# Patient Record
Sex: Female | Born: 1970 | Race: Black or African American | Hispanic: No | Smoking: Never smoker
Health system: Southern US, Community
[De-identification: ages and names within clinical notes are randomized; demographics above are authoritative.]

## PROBLEM LIST (undated history)

## (undated) DIAGNOSIS — I10 Essential (primary) hypertension: Secondary | ICD-10-CM

## (undated) DIAGNOSIS — E119 Type 2 diabetes mellitus without complications: Secondary | ICD-10-CM

## (undated) DIAGNOSIS — M25561 Pain in right knee: Secondary | ICD-10-CM

## (undated) DIAGNOSIS — E7800 Pure hypercholesterolemia, unspecified: Secondary | ICD-10-CM

## (undated) DIAGNOSIS — D649 Anemia, unspecified: Secondary | ICD-10-CM

## (undated) HISTORY — PX: BRAIN SURGERY: SHX531

## (undated) HISTORY — PX: ABDOMINAL HYSTERECTOMY: SHX81

## (undated) HISTORY — DX: Pain in right knee: M25.561

## (undated) HISTORY — DX: Pure hypercholesterolemia, unspecified: E78.00

## (undated) HISTORY — DX: Anemia, unspecified: D64.9

## (undated) HISTORY — DX: Type 2 diabetes mellitus without complications: E11.9

## (undated) HISTORY — PX: CHOLECYSTECTOMY: SHX55

---

## 1998-06-28 ENCOUNTER — Emergency Department (HOSPITAL_COMMUNITY): Admission: EM | Admit: 1998-06-28 | Discharge: 1998-06-28 | Payer: Self-pay | Admitting: Emergency Medicine

## 1999-03-06 ENCOUNTER — Other Ambulatory Visit: Admission: RE | Admit: 1999-03-06 | Discharge: 1999-03-06 | Payer: Self-pay | Admitting: Family Medicine

## 1999-03-17 ENCOUNTER — Other Ambulatory Visit: Admission: RE | Admit: 1999-03-17 | Discharge: 1999-03-17 | Payer: Self-pay | Admitting: Obstetrics and Gynecology

## 1999-03-17 ENCOUNTER — Encounter (INDEPENDENT_AMBULATORY_CARE_PROVIDER_SITE_OTHER): Payer: Self-pay

## 1999-08-04 ENCOUNTER — Ambulatory Visit (HOSPITAL_COMMUNITY): Admission: RE | Admit: 1999-08-04 | Discharge: 1999-08-04 | Payer: Self-pay | Admitting: Family Medicine

## 1999-08-04 ENCOUNTER — Encounter: Payer: Self-pay | Admitting: Family Medicine

## 2003-07-31 ENCOUNTER — Emergency Department (HOSPITAL_COMMUNITY): Admission: EM | Admit: 2003-07-31 | Discharge: 2003-07-31 | Payer: Self-pay | Admitting: Emergency Medicine

## 2003-08-16 ENCOUNTER — Other Ambulatory Visit: Admission: RE | Admit: 2003-08-16 | Discharge: 2003-08-16 | Payer: Self-pay | Admitting: Gynecology

## 2003-10-02 ENCOUNTER — Ambulatory Visit: Admission: RE | Admit: 2003-10-02 | Discharge: 2003-10-02 | Payer: Self-pay | Admitting: Gynecology

## 2003-11-21 ENCOUNTER — Inpatient Hospital Stay (HOSPITAL_COMMUNITY): Admission: RE | Admit: 2003-11-21 | Discharge: 2003-11-24 | Payer: Self-pay | Admitting: Surgery

## 2003-11-21 ENCOUNTER — Encounter (INDEPENDENT_AMBULATORY_CARE_PROVIDER_SITE_OTHER): Payer: Self-pay | Admitting: Specialist

## 2005-02-02 ENCOUNTER — Ambulatory Visit: Payer: Self-pay | Admitting: Family Medicine

## 2005-02-02 ENCOUNTER — Other Ambulatory Visit: Admission: RE | Admit: 2005-02-02 | Discharge: 2005-02-02 | Payer: Self-pay | Admitting: Family Medicine

## 2005-12-31 ENCOUNTER — Ambulatory Visit: Payer: Self-pay | Admitting: Family Medicine

## 2006-01-14 ENCOUNTER — Encounter: Admission: RE | Admit: 2006-01-14 | Discharge: 2006-01-14 | Payer: Self-pay | Admitting: Family Medicine

## 2007-11-27 ENCOUNTER — Encounter: Admission: RE | Admit: 2007-11-27 | Discharge: 2007-11-27 | Payer: Self-pay | Admitting: Family Medicine

## 2007-12-13 ENCOUNTER — Encounter: Admission: RE | Admit: 2007-12-13 | Discharge: 2008-01-05 | Payer: Self-pay | Admitting: Internal Medicine

## 2011-01-22 NOTE — Discharge Summary (Signed)
NAME:  Lisa Cross, Lisa Cross                          ACCOUNT NO.:  [id number]   MEDICAL RECORD NO.:  [id number]                   PATIENT TYPE:  INP   LOCATION:  0471                                 FACILITY:  [facility]   PHYSICIAN:  Ivor Costa. Farrel Gobble, M.D.              DATE OF BIRTH:  02/24/1971   DATE OF ADMISSION:  11/21/2003  DATE OF DISCHARGE:  11/24/2003                                 DISCHARGE SUMMARY   CHIEF COMPLAINT:  Large symptomatic fibroid uterus.   ADDITIONAL COMPLAINT:  Symptomatic gallbladder.   HISTORY OF PRESENT ILLNESS:  Please refer to the dictated H&P. Briefly, the  patient is a 40 year old, G1, P1, who was noted to have a large fibroid  uterus measuring 20 x 13.5 x 15.5. She was also noted to have sludge in her  gallbladder as well as elevated LFTs and abnormal coags.   HOSPITAL COURSE:  Because of the above complaints, the patient presented to  the hospital on the morning of November 21, 2003, and underwent a laparoscopic  cholecystectomy done by Dr. Ezzard Standing. Afterwards, the patient had a total  abdominal hysterectomy, left salpingo-oophorectomy, right salpingectomy, and  lysis of adhesions done by myself. Intraoperatively, she was noted to have a  large fibroid uterus that was approximately 24 weeks size with a single  lesion. She was noted to have bilateral hydrosalpinx, extensive tubo-ovarian  adhesions, bladder adhesions, but no evidence of endometriosis. Her blood  loss intraoperatively was approximately 400 from a hysterectomy perspective.  She was extubated in the OR, transferred to the PACU and then to the  postoperative floor in due fashion. Her postoperative course was  unremarkable. She remained afebrile and she vital signs were stable. She  reached a normal postoperative milestone appropriately. Her postoperative  hemoglobin and hematocrit were 8.8 and 27.5 with white count of 8.8 and  platelet count 355,000. By the time of discharge, postoperative day #3,  the  patient was tolerating a regular diet without any difficulty, she was  ambulating without any difficulty, she was using an appropriate amount of  pain medication, incision was intact with staples in place.  As a result  there of the patient was discharged home on postoperative day #3. She was  instructed from a GYN perspective that she would follow up next week for  staple removal. She will use over-the-counter Motrin for pain medication.  The patient will be given a prescription for Percocet for her original  scheduled January surgery; however, she believes she has lost her  prescription. She was given a prescription for Tylox one to two q.4h. p.r.n.  pain, #40, without refill. She was instructed to avoid driving for the first  two weeks. She will follow up with general surgeons as they have scheduled.  She will follow up with [place] in one week for staple removal, in six weeks for  usual postoperative follow-up.  At the time of this  dictation the final  pathology is still pending.                                               Ivor Costa. Farrel Gobble, M.D.    Leda Roys  D:  11/24/2003  T:  11/26/2003  Job:  [id number]

## 2011-01-22 NOTE — H&P (Signed)
NAME:  Lisa Cross, Lisa Cross                          ACCOUNT NO.:  [id number]   MEDICAL RECORD NO.:  [id number]                   PATIENT TYPE:  INP   LOCATION:  NA                                   FACILITY:  [facility]   PHYSICIAN:  Ivor Costa. Farrel Gobble, M.D.              DATE OF BIRTH:  1970/10/05   DATE OF ADMISSION:  DATE OF DISCHARGE:                                HISTORY & PHYSICAL   CHIEF COMPLAINT:  Fibroid uterus.   HISTORY OF PRESENT ILLNESS:  The patient is a 40 year old G1, P1, referred  by Dr. Ezzard Standing for evaluation of a fibroid uterus back in December 2004.  The  patient had presented to [facility] earlier with acute onset of low back  pain.  As part of their evaluation the patient had an upper GI, abdominal  pelvic ultrasound done at [facility].  The abdominal ultrasound  was remarkable for a large fibroid uterus that measured 20 x 13.5 x 15.5 cm.  There was a fundal fibroid felt to be 13 x 11 x 13.  The patient was also  noted to have a gallbladder with echogenic material, gallstones and sludge.  The patient had had one spontaneous pregnancy 15 years ago.  She has been  using withdrawal for contraception since that time.  She has not conceived a  pregnancy but states that she may be interested in getting pregnant.  As  part of her evaluation from [facility], the patient had a metabolic panel  done.  She was noted to have elevated LFTs, for which hepatitis serologies  were done.  These were negative.   PAST OBSTETRICAL/GYNECOLOGIC HISTORY:  The patient states her menses are  monthly.  She has five days of flow.  The last Pap was within normal limits.  Denies a history of abnormal Pap smears.   PAST MEDICAL HISTORY:  Significant for brain tumor diagnosed in 1996, for  which she had some sort of surgery done, which turned out to be benign.  The  patient could not further elaborate.   MEDICATIONS:  None.   ALLERGIES:  None.   SOCIAL HISTORY:  No alcohol, tobacco,  caffeine.   FAMILY HISTORY:  Negative for GYN cancers, negative for breast cancer.  Her  parents are alive and well.   PHYSICAL EXAMINATION:  GENERAL:  She is well-appearing, in no acute  distress.  CARDIAC:  Her heart was regular rate.  CHEST:  Her lungs are clear to auscultation.  BREASTS:  Without mass, discharge, retractions, or skin changes.  ABDOMEN:  There is a protuberant mass that measures 4 cm above the umbilicus  and lateral hip to hip consistent with her uterus.  It is mobile.  PELVIC:  On GYN exam she has normal female genitalia.  The BUS is negative,  and the vagina was pink and moist.  The cervix is without lesions.  Bimanual  exam:  A fibroid is palpable  in the fundal area.  It is sitting above the  pubic symphysis.  The uterus is almost completely palpable separate from  this area.  This is appreciated mostly rectovaginally.  The adnexa were not  palpable.  There was no noted impingement on rectal exam.   The findings were discussed with the patient.  Because of the excessive size  of this fibroid, we recommend a myomectomy.  The patient is aware that she  is at increased risk for a hysterectomy.  Knowing her desire for a possible  additional pregnancy, we had recommended that the patient proceed with an  HSG.  She, unfortunately, failed to have that done.  Her surgery was  originally scheduled in January and although it was cancelled because of  illness, she still failed to have that done.  However, if possible, we will  try to preserve her fertility.  Because of her elevated LFTs, she will  proceed with a cholecystectomy to be done prior to our procedure.  She had  coagulation studies that were mildly elevated done for the January surgery.  The repeat labs are not available at the time of this dictation.  She had  been given a prescription for Lortab 5 mg one to two q.4h. p.r.n., #40,  without refill at the time of our preop consultation.  All questions were   addressed.  Again, she will present for a cholecystectomy to be done prior  to her myomectomy, possible hysterectomy, in the morning.                                               Ivor Costa. Farrel Gobble, M.D.    THL/MEDQ  D:  11/20/2003  T:  11/20/2003  Job:  [id number]

## 2011-01-22 NOTE — Op Note (Signed)
NAME:  Lisa Cross, Lisa Cross                          ACCOUNT NO.:  [id number]   MEDICAL RECORD NO.:  [id number]                   PATIENT TYPE:  INP   LOCATION:  0005                                 FACILITY:  [facility]   PHYSICIAN:  Sandria Bales. Ezzard Standing, M.D.               DATE OF BIRTH:  July 06, 1971   DATE OF PROCEDURE:  11/21/2003  DATE OF DISCHARGE:                                 OPERATIVE REPORT   PREOPERATIVE DIAGNOSES:  1. Chronic cholecystitis and cholelithiasis and elevated liver function     tests.  2. Large uterine fibroid.   POSTOPERATIVE DIAGNOSES:  1. Chronic cholecystitis and cholelithiasis.  2. Large uterine fibroid.   OPERATION/PROCEDURE:  1. Laparoscopic cholecystectomy with intraoperative cholangiogram.  2. Dr. Douglass Rivers will dictate the surgery for the uterine fibroid.   SURGEON:  Sandria Bales. Ezzard Standing, M.D.   FIRST ASSISTANT:  Anselm Pancoast. Zachery Dakins, M.D.   ANESTHESIA:  General endotracheal anesthesia.   ESTIMATED BLOOD LOSS:  Minimal.   INDICATIONS FOR PROCEDURE:  Lisa Cross is a 40 year old black female who  presented with a large uterine fibroid which extends above her umbilicus.  She has seen Dr. Douglass Rivers for this and plans surgery for management of  this uterine fibroid.  At the same time, she was found to have gallstones on  an ultrasound with mildly elevated liver functions initially which has now  returned to normal, so I plan to do a cholecystectomy at the same time.   The indications and potential complications were explained to the patient.  Potential complications include but not limited to bleeding, infection, bile  duct injury, open surgery.  The patient understands Dr. Farrel Gobble will be  doing open surgery to get the uterine fibroid out.   DESCRIPTION OF PROCEDURE:  The patient is placed in the supine position,  given a general endotracheal anesthesia.  She had an abdominal prep with  Betadine solution and sterilely draped.   I went through  infraumbilical incision with sharp dissection carried down to  the abdominal cavity.  I inserted a 12 mm Hasson trocar, secured this with a  0 Vicryl suture and inserted a 0-degree, 10 mm laparoscope.   The right and left lobes of the liver were unremarkable.  Anterior wall of  the stomach was unremarkable.  The gallbladder had evidence of some scar  tissue around it consistent with chronic cholecystitis.  In the abdomen, the  patient had a dominant uterine fibroid.  I did take pictures of this uterine  fibroid.   Three additional trocars were placed.  A 10 mm Ethicon trocar in the  subxiphoid location, a 5 mm Ethicon trocar in the mid subcostal location and  a 5 mm Ethicon trocar in the right lateral subcostal location.  The  gallbladder was identified, grasped, rotated cephalad.  Sharp dissection was  carried out identifying the cystic node.  The gallbladder cystic duct was  fairly  coiled.  I straightened this out with dissection, identified a cystic  artery which I triply then clipped and divided.  Then identified the  junction of the gallbladder and the cystic duct.  I put a clip on the side  of the gallbladder.   The intraoperative cholangiogram was shot using cut-off Taut catheter  inserted through a 14-gauge Jelco catheter.  This Taut catheter was inserted  in the side of the cut cystic duct.  I injected about 6 mL of half-strength  Hypaque solution into the cystic duct.  This flowed freely down the common  bile duct into the duodenum and refluxed back up the hepatic radical.  This  was felt to be a normal intraoperative cholangiogram due to no filling  defect, no abnormality.  The Taut catheter was then removed.  The cystic  duct was then triply clipped and divided.  We sharply and bluntly dissected  the gallbladder bed using primarily hook electrocautery.  I did find a  posterior branch of the cystic artery which I doubly clipped, hooked and  divided.  Then __________ division  of the gallbladder bed.  I revisualized  the triangle of Calot and revisualized the gallbladder bed.  There was no  bleeding, no bile leak.  The gallbladder was then divided, placed it into an  EndoCatch and delivered through the umbilicus.   I irrigated the abdomen out with 500 mL of saline.  I then removed all the  trocars under visualization.  There was no bleeding at each trocar site.  I  closed the skin at the two 5 mm trocars and the 10 mm trocar in the  subxiphoid location.  I used to 0 Vicryl suture to close the umbilical  trocar but left the skin open.  Dr. Farrel Gobble plans to go through a lower mid  abdominal incision and will probably use my infraumbilical incision as part  of her incision.   This ends my part of the operation.  At this point Dr. Farrel Gobble came in and  scrubbed to start the surgery on the uterine fibroid.                                               Sandria Bales. Ezzard Standing, M.D.    DHN/MEDQ  D:  11/21/2003  T:  11/21/2003  Job:  [id number]   cc:   Ivor Costa. Farrel Gobble, M.D.  [address], [place]  [place]  [place] [address]  Fax: [phone]   Gabriel Earing, M.D.  [address]  [place]  [place] [address]  Fax: [phone]

## 2012-12-03 ENCOUNTER — Ambulatory Visit (INDEPENDENT_AMBULATORY_CARE_PROVIDER_SITE_OTHER): Payer: Managed Care, Other (non HMO) | Admitting: Physician Assistant

## 2012-12-03 VITALS — BP 168/95 | HR 92 | Temp 98.0°F | Resp 16 | Ht 65.18 in | Wt 213.0 lb

## 2012-12-03 DIAGNOSIS — K130 Diseases of lips: Secondary | ICD-10-CM

## 2012-12-03 DIAGNOSIS — Z0000 Encounter for general adult medical examination without abnormal findings: Secondary | ICD-10-CM

## 2012-12-03 DIAGNOSIS — N898 Other specified noninflammatory disorders of vagina: Secondary | ICD-10-CM

## 2012-12-03 DIAGNOSIS — B9689 Other specified bacterial agents as the cause of diseases classified elsewhere: Secondary | ICD-10-CM

## 2012-12-03 DIAGNOSIS — N760 Acute vaginitis: Secondary | ICD-10-CM

## 2012-12-03 LAB — POCT WET PREP WITH KOH
Clue Cells Wet Prep HPF POC: 100
KOH Prep POC: NEGATIVE
RBC Wet Prep HPF POC: NEGATIVE
Trichomonas, UA: NEGATIVE
WBC Wet Prep HPF POC: NEGATIVE
Yeast Wet Prep HPF POC: NEGATIVE

## 2012-12-03 LAB — LIPID PANEL
Cholesterol: 147 mg/dL (ref 0–200)
HDL: 34 mg/dL — ABNORMAL LOW (ref >39)
LDL Cholesterol: 87 mg/dL (ref 0–99)
Total CHOL/HDL Ratio: 4.3 Ratio
Triglycerides: 131 mg/dL (ref <150)
VLDL: 26 mg/dL (ref 0–40)

## 2012-12-03 LAB — POCT CBC
Granulocyte percent: 53.3 %G (ref 37–80)
HCT, POC: 39.5 % (ref 37.7–47.9)
Hemoglobin: 12.3 g/dL (ref 12.2–16.2)
Lymph, poc: 2.4 (ref 0.6–3.4)
MCH, POC: 25.6 pg — AB (ref 27–31.2)
MCHC: 31.1 g/dL — AB (ref 31.8–35.4)
MCV: 82.2 fL (ref 80–97)
MID (cbc): 0.4 (ref 0–0.9)
MPV: 8.7 fL (ref 0–99.8)
POC Granulocyte: 3.3 (ref 2–6.9)
POC LYMPH PERCENT: 39.4 %L (ref 10–50)
POC MID %: 7.3 %M (ref 0–12)
Platelet Count, POC: 364 10*3/uL (ref 142–424)
RBC: 4.8 M/uL (ref 4.04–5.48)
RDW, POC: 15.6 %
WBC: 6.1 10*3/uL (ref 4.6–10.2)

## 2012-12-03 LAB — HIV ANTIBODY (ROUTINE TESTING W REFLEX): HIV: NONREACTIVE

## 2012-12-03 LAB — RPR

## 2012-12-03 LAB — POCT URINALYSIS DIPSTICK
Bilirubin, UA: NEGATIVE
Blood, UA: NEGATIVE
Glucose, UA: NEGATIVE
Ketones, UA: NEGATIVE
Leukocytes, UA: NEGATIVE
Nitrite, UA: NEGATIVE
Protein, UA: NEGATIVE
Spec Grav, UA: 1.02
Urobilinogen, UA: 0.2
pH, UA: 5

## 2012-12-03 LAB — COMPREHENSIVE METABOLIC PANEL
ALT: 16 U/L (ref 0–35)
AST: 12 U/L (ref 0–37)
Albumin: 4.2 g/dL (ref 3.5–5.2)
Alkaline Phosphatase: 68 U/L (ref 39–117)
BUN: 13 mg/dL (ref 6–23)
CO2: 25 mEq/L (ref 19–32)
Calcium: 9.5 mg/dL (ref 8.4–10.5)
Chloride: 107 mEq/L (ref 96–112)
Creat: 0.81 mg/dL (ref 0.50–1.10)
Glucose, Bld: 127 mg/dL — ABNORMAL HIGH (ref 70–99)
Potassium: 5 mEq/L (ref 3.5–5.3)
Sodium: 139 mEq/L (ref 135–145)
Total Bilirubin: 0.4 mg/dL (ref 0.3–1.2)
Total Protein: 7.2 g/dL (ref 6.0–8.3)

## 2012-12-03 LAB — TSH: TSH: 1.003 u[IU]/mL (ref 0.350–4.500)

## 2012-12-03 MED ORDER — METRONIDAZOLE 0.75 % VA GEL: 1.0000 | Freq: Every day | VAGINAL | Status: DC

## 2012-12-03 MED ORDER — CLOTRIMAZOLE 1 % EX CREA: TOPICAL_CREAM | Freq: Two times a day (BID) | CUTANEOUS | Status: DC

## 2012-12-03 NOTE — Progress Notes (Signed)
Subjective:    Patient ID: Lisa Cross, female    DOB: Aug 29, 1971, 42 y.o.   MRN: [id number]  HPI 42 year old female here today for CPE.  Last physical 1 year ago. No known medical problems. Does have history of GI complaints including abdominal pain and loose stools after meals. This has been worked up by her previous PCP and everything was found to be normal. She has not seen a GI doctor for this.  Had gallbladder out but that has not helped her pain.  History of fibroid tumor removed as well.  Also complains of inability to lose weight despite several different trials of diet pills, exercise, and healthy eating.  Is interested in having an appetite suppressant rx'ed. She has never had a mammogram. Does do self breast exams and has no areas of concern.  Admits to slight vaginal discharge that she has noticed over the past few weeks.  Complains of skin irritation around her mouth x 3 days. Describes as a burning, stinging sensation that is slightly pruritic. She has been using vitamin E ointment and cocoa butter which have not helped.  No history of cold sores.   In addition she would like to be screened for STI's today because she is concerned her husband has not been faithful. She is married with 1 child who is 25.    Review of Systems  Constitutional: Negative.   HENT: Negative.   Eyes: Negative.   Respiratory: Negative.   Cardiovascular: Negative.   Gastrointestinal: Positive for abdominal pain and diarrhea (loose stools). Negative for nausea, vomiting and blood in stool.  Endocrine: Negative.   Genitourinary: Positive for vaginal discharge.  Musculoskeletal: Negative.   Skin: Positive for rash (around mouth).  Allergic/Immunologic: Negative.   Neurological: Negative.   Hematological: Negative.   Psychiatric/Behavioral: Negative.        Objective:   Physical Exam  Constitutional: She is oriented to person, place, and time. She appears well-developed and well-nourished.  HENT:   Head: Normocephalic and atraumatic.  Right Ear: Hearing, tympanic membrane, external ear and ear canal normal.  Left Ear: Hearing, external ear and ear canal normal.  Mouth/Throat: Uvula is midline, oropharynx is clear and moist and mucous membranes are normal. No oropharyngeal exudate.  Eyes: Conjunctivae are normal.  Neck: Normal range of motion. Neck supple. No thyromegaly present.  Cardiovascular: Normal rate, regular rhythm and normal heart sounds.   Pulmonary/Chest: Effort normal and breath sounds normal.  Abdominal: Soft. Bowel sounds are normal. There is no tenderness. There is no rebound and no guarding.  Genitourinary: Vagina normal. Pelvic exam was performed with patient supine. There is no rash or tenderness on the right labia. There is no rash or tenderness on the left labia. Right adnexum displays no mass. Left adnexum displays no mass. No erythema around the vagina. No vaginal discharge found.  Patient s/p hysterectomy   Lymphadenopathy:    She has no cervical adenopathy.  Neurological: She is alert and oriented to person, place, and time.  Psychiatric: She has a normal mood and affect. Her behavior is normal. Judgment and thought content normal.          Assessment & Plan:  1. Routine general medical examination at a health care facility - Plan: POCT CBC, POCT urinalysis dipstick, HIV antibody, RPR, Lipid panel, TSH, Comprehensive metabolic panel, Pap IG, CT/NG w/ reflex HPV when ASC-U, MM Digital Screening  -Labs pending  -Blood pressure elevated today. Discussed with patient - states she has  history of elevated BP while at the doctor's  office, but at home runs 120/70-130/80.  Recommend close monitoring - if continues to be high, will need to consider  trial of medication 2. Leukorrhea, not specified as infective - Plan: POCT Wet Prep with KOH  -Metronidazole gel 1 applicatorful qhs x 5 days 3. Angular cheilitis - Plan: clotrimazole (LOTRIMIN) 1 % cream  -Clotrimazole  1% cream twice daily.   -Hydrocortisone cream 1-2 hours after application of clotrimazole.  4. Bacterial vaginosis - Plan: metroNIDAZOLE (METROGEL) 0.75 % vaginal gel  -See above

## 2012-12-05 LAB — PAP IG, CT-NG, RFX HPV ASCU
Chlamydia Probe Amp: NEGATIVE
GC Probe Amp: NEGATIVE

## 2012-12-08 ENCOUNTER — Ambulatory Visit: Payer: Managed Care, Other (non HMO) | Admitting: Physician Assistant

## 2012-12-08 VITALS — BP 138/100 | HR 96 | Temp 98.6°F | Resp 18 | Ht 65.0 in | Wt 213.0 lb

## 2012-12-08 DIAGNOSIS — R21 Rash and other nonspecific skin eruption: Secondary | ICD-10-CM

## 2012-12-08 NOTE — Patient Instructions (Signed)
Thin layer of hydrocortisone at bedtime. No longer than 2 weeks.  Aquaphor or Vaseline to lips to keep moisture in Facial moisturizer such as Cetaphil or Neutragena with SPF to face daily in the morning.  This should resolve on its own - it may take 1-2 weeks. Please let us know if this is not improving and we can organize a Dermatology Referral.

## 2012-12-08 NOTE — Progress Notes (Signed)
  Subjective:    Patient ID: Lisa Cross, female    DOB: July 29, 1971, 42 y.o.   MRN: [id number]  HPI 42 year old female presents for recheck of rash on lips. Was here on 12/04/12 and placed on clotrimazole cream twice daily with hydrocortisone cream at bedtime.  She has been doing this and states the burning sensation and red irritation has significantly improved. However now she has a darkened area above her lips and at both corners.  She states there is no irritation, but she is concerned about the appearance. Has stopped both clotrimazole and hydrocortisone creams.  Admits there is some peeling of her lips but otherwise she feels well.      Review of Systems  Constitutional: Negative for fever and chills.  Gastrointestinal: Negative for nausea and vomiting.  Musculoskeletal: Negative for myalgias and arthralgias.  Skin: Positive for rash.  Neurological: Negative for dizziness and headaches.       Objective:   Physical Exam  Constitutional: She is oriented to person, place, and time. She appears well-developed and well-nourished.  HENT:  Head: Normocephalic and atraumatic.  Right Ear: External ear normal.  Left Ear: External ear normal.  Eyes: Conjunctivae are normal.  Neck: Normal range of motion.  Cardiovascular: Normal rate.   Pulmonary/Chest: Effort normal.  Neurological: She is alert and oriented to person, place, and time.  Skin:  Area of hyperpigmentation on upper lip and at both corners of the her mouth.  No warmth, drainage, vesicles, or blisters.  Mild peeling of lips.   Psychiatric: She has a normal mood and affect. Her behavior is normal. Judgment and thought content normal.          Assessment & Plan:  Rash and nonspecific skin eruption  Likely post-inflammatory hyperpigmentation.   Reassurance provided Continue hydrocortisone cream - thin layer at bedtime followed by Aquaphor or Vaseline Daily facial moisturizer with SPF - Cetaphil or Neutragena recommended.    If no improvement in 1-2 weeks, referral to Dermatology

## 2012-12-10 ENCOUNTER — Ambulatory Visit: Payer: Managed Care, Other (non HMO) | Admitting: Family Medicine

## 2012-12-10 VITALS — BP 164/98 | HR 109 | Temp 98.9°F | Resp 17 | Ht 65.5 in | Wt 215.0 lb

## 2012-12-10 DIAGNOSIS — R7309 Other abnormal glucose: Secondary | ICD-10-CM

## 2012-12-10 DIAGNOSIS — N898 Other specified noninflammatory disorders of vagina: Secondary | ICD-10-CM

## 2012-12-10 DIAGNOSIS — R739 Hyperglycemia, unspecified: Secondary | ICD-10-CM

## 2012-12-10 LAB — POCT WET PREP WITH KOH
KOH Prep POC: NEGATIVE
Trichomonas, UA: NEGATIVE
Yeast Wet Prep HPF POC: NEGATIVE

## 2012-12-10 LAB — GLUCOSE, POCT (MANUAL RESULT ENTRY): POC Glucose: 166 mg/dl — AB (ref 70–99)

## 2012-12-10 NOTE — Progress Notes (Signed)
Subjective: 42 year old lady who was treated here with a week ago for a nonspecific leukorrhea. She was given MetroGel. She sees her for a week, and it is felt weak. She continues to have a profuse creamy discharge. He does not have much symptoms. Her last intercourse was 2 weeks ago at which time she used protection. She has had a hysterectomy as well as a unilateral oophorectomy. She did not notice any odor to the discharge. The other cream is given her for a rash is doing well. She has been told she was borderline diabetic  Objective: Overweight Afro-American female in no acute distress. Normal external genitalia. Vaginal mucosa looks pink and healthy. One Wilford cough of whitish stuff, but otherwise just a Kelleher mucoid discharge. Wet prep was taken.  Assessment: Vaginal discharge History of "borderline" diabetes but not being diabetic.  Plan: Wet prep Fingerstick glucose  Results for orders placed in visit on 12/10/12  GLUCOSE, POCT (MANUAL RESULT ENTRY)      Result Value Range   POC Glucose 166 (*) 70 - 99 mg/dl  POCT WET PREP WITH KOH      Result Value Range   Trichomonas, UA Negative     Clue Cells Wet Prep HPF POC 0-3     Epithelial Wet Prep HPF POC 1-8     Yeast Wet Prep HPF POC neg     Bacteria Wet Prep HPF POC 1+     RBC Wet Prep HPF POC 0-1     WBC Wet Prep HPF POC 0-3     KOH Prep POC Negative

## 2012-12-10 NOTE — Patient Instructions (Addendum)
Work hard on weight loss and exercise and eating less on consistent basis.  Return in June for a followup check on your sugars.  Do nothing for the vagina at this time, and I think it will do fine over the next week.

## 2012-12-20 ENCOUNTER — Ambulatory Visit (HOSPITAL_COMMUNITY)
Admission: RE | Admit: 2012-12-20 | Discharge: 2012-12-20 | Disposition: A | Discharge status: Home / Self Care | Payer: Managed Care, Other (non HMO) | Source: Ambulatory Visit | Attending: Physician Assistant | Admitting: Physician Assistant

## 2012-12-20 DIAGNOSIS — Z1231 Encounter for screening mammogram for malignant neoplasm of breast: Secondary | ICD-10-CM | POA: Insufficient documentation

## 2012-12-20 DIAGNOSIS — Z0000 Encounter for general adult medical examination without abnormal findings: Secondary | ICD-10-CM

## 2012-12-22 ENCOUNTER — Ambulatory Visit: Payer: Managed Care, Other (non HMO) | Admitting: Physician Assistant

## 2012-12-22 VITALS — BP 155/98 | HR 94 | Temp 98.5°F | Resp 16 | Ht 65.0 in | Wt 213.0 lb

## 2012-12-22 DIAGNOSIS — K130 Diseases of lips: Secondary | ICD-10-CM

## 2012-12-22 DIAGNOSIS — Z111 Encounter for screening for respiratory tuberculosis: Secondary | ICD-10-CM

## 2012-12-22 NOTE — Progress Notes (Signed)
   [address] [place] [address]   Phone [phone]  Subjective:    Patient ID: Lisa Cross, female    DOB: [birth year], 42 y.o.   MRN: [id number]  HPI Pt presents to clinic for recheck of rash on her lips.  She was seen 3/30 and 4/4 for this rash.  It got better on the antifungal cream and hydrocortisone cream but her skin above her lip is still getting hyperpigmented and the edges of her upper lip still seem swollen and red.  She has returned to using the clotrimazole but not seen a difference.  She did change toothpaste and when she brushed her teeth she gets toothpaste everywhere around her mouth.  She also needs a TB test to become a foster parent for the 1st time.     Review of Systems  Skin: Positive for rash.       Objective:   Physical Exam  Vitals reviewed. Constitutional: She appears well-developed and well-nourished.  HENT:  Head: Normocephalic and atraumatic.  Right Ear: External ear normal.  Left Ear: External ear normal.  Pulmonary/Chest: Effort normal.  Skin: Skin is warm and dry. Rash (erythematous papular rash along the vermillion border with hyperpigmentation above the upper lip and towards the corners of her lips) noted.  Psychiatric: She has a normal mood and affect. Her behavior is normal. Judgment and thought content normal.       Assessment & Plan:   Rash on lips - I am unsure of the exact etiology of her rash but it appears to be a contact dermatitis and I have to wonder whether it is to floride in her new toothpaste.  She will plan on getting a fluoride free toothpaste and work on not getting toothpaste foam over her lips.   She will stop all creams except for vaseline.  Plan: Ambulatory referral to Dermatology in case this does not get better and patient would like to be evaluated for multiple areas of hyperpigmentation.   Screening-pulmonary TB - letter give TB free  Virgilio Belling 12/22/2012 5:59 PM

## 2012-12-22 NOTE — Patient Instructions (Signed)
[place] or [place] - most important is flouride free   [employer] or [employer]

## 2012-12-22 NOTE — Progress Notes (Signed)
  Tuberculosis Risk Questionnaire  1. Were you born outside the [place] in one of the following parts of the world:    [place], [place], [place], [place] or Eastern [place]?  No  2. Have you traveled outside the [place] and lived for more than one month in one of the following parts of the world:  Af[place], [place], [place], [place] or [place]?  No  3. Do you have a compromised immune system such as from any of the following conditions:  HIV/AIDS, organ or bone marrow transplantation, diabetes, immunosuppressive   medicines (e.g. Prednisone, Remicaide), leukemia, lymphoma, cancer of the   head or neck, gastrectomy or jejunal bypass, end-stage renal disease (on   dialysis), or silicosis?  No    4. Have you ever done one of the following:    Used crack cocaine, injected illegal drugs, worked or resided in jail or prison,   worked or resided at a homeless shelter, or worked as a healthcare worker in   direct contact with patients?  No  5. Have you ever been exposed to anyone with infectious tuberculosis?  No Tuberculosis Symptom Questionnaire  Do you currently have any of the following symptoms?  1. Unexplained cough lasting more than 3 weeks? No  Unexplained fever lasting more than 3 weeks. No   3. Night Sweats (sweating that leaves the bedclothes and sheets wet)   No  4. Shortness of Breath No  5. Chest Pain No  6. Unintentional weight loss  No  7. Unexplained fatigue (very tired for no reason) No 

## 2013-07-17 ENCOUNTER — Ambulatory Visit: Payer: Managed Care, Other (non HMO) | Admitting: Family Medicine

## 2013-07-17 VITALS — BP 138/80 | HR 121 | Temp 99.0°F | Resp 18 | Ht 65.0 in | Wt 208.0 lb

## 2013-07-17 DIAGNOSIS — L03115 Cellulitis of right lower limb: Secondary | ICD-10-CM

## 2013-07-17 DIAGNOSIS — L02419 Cutaneous abscess of limb, unspecified: Secondary | ICD-10-CM

## 2013-07-17 DIAGNOSIS — M79609 Pain in unspecified limb: Secondary | ICD-10-CM

## 2013-07-17 MED ORDER — DOXYCYCLINE HYCLATE 100 MG PO CAPS: 100.0000 mg | ORAL_CAPSULE | Freq: Two times a day (BID) | ORAL | Status: DC

## 2013-07-17 MED ORDER — HYDROCODONE-ACETAMINOPHEN 5-325 MG PO TABS: 1.0000 | ORAL_TABLET | Freq: Four times a day (QID) | ORAL | Status: DC | PRN

## 2013-07-17 NOTE — Progress Notes (Signed)
°  Subjective:    Patient ID: Lisa Cross, female    DOB: [birth year], 42 y.o.   MRN: [id number]  This chart was scribed for Elvina Sidle, MD by Blanchard Kelch, ED Scribe. The patient was seen in room 1. Patient's care was started at 8:24 PM.   HPI  Lisa Cross is a 42 y.o. female who presents to office complaining of two abscesses, one on her right leg and one on her right arm that began five days ago. The abscess on her leg has been draining bloody, purulent discharge but the arm has not. There is no chance she is pregnant due to a past surgical history of hysterectomy.   She works in a [employer].  Review of Systems  Constitutional: Negative for fever.  HENT: Negative for drooling.   Eyes: Negative for discharge.  Respiratory: Negative for cough.   Cardiovascular: Negative for leg swelling.  Gastrointestinal: Negative for vomiting.  Endocrine: Negative for polyuria.  Genitourinary: Negative for hematuria.  Musculoskeletal: Negative for gait problem.  Skin: Positive for wound (abscesses). Negative for rash.  Allergic/Immunologic: Negative for immunocompromised state.  Neurological: Negative for speech difficulty.  Hematological: Negative for adenopathy.  Psychiatric/Behavioral: Negative for confusion.       Objective:   Physical Exam  Nursing note and vitals reviewed. Constitutional: She is oriented to person, place, and time. She appears well-developed and well-nourished. No distress.  HENT:  Head: Normocephalic and atraumatic.  Eyes: EOM are normal.  Neck: Neck supple. No tracheal deviation present.  Cardiovascular: Normal rate.   Pulmonary/Chest: Effort normal. No respiratory distress.  Musculoskeletal: Normal range of motion.  Neurological: She is alert and oriented to person, place, and time.  Skin: Skin is warm and dry.  Abscess present on right thigh with surrounding erythema, edema and purulent, bloody drainage. Smaller abscess present on right upper arm with  surrounding erythema and edema but no drainage.   Psychiatric: She has a normal mood and affect. Her behavior is normal.    Assessment & Plan:    I personally performed the services described in this documentation, which was scribed in my presence. The recorded information has been reviewed and is accurate.  Cellulitis of right thigh - Plan: Wound culture, HYDROcodone-acetaminophen (NORCO/VICODIN) 5-325 MG per tablet, doxycycline (VIBRAMYCIN) 100 MG capsule  Signed, Elvina Sidle, MD

## 2013-07-17 NOTE — Progress Notes (Signed)
   Patient ID: Lisa Cross MRN: [id number], DOB: [birth year], 42 y.o. Date of Encounter: 07/17/2013, 9:37 PM    PROCEDURE NOTE: Right lateral arm: Verbal consent obtained. Risks and benefits of the procedure were explained to the patient. Patient made an informed decision to proceed with the procedure. Betadine prep per usual protocol. Local anesthesia obtained with 1% lidocaine with epi 3 cc.   1.5 cm incision made with 11 blade along lesion.  Culture taken by Dr. Milus Glazier along the right anterior thigh.   Moderate amount of purulence expressed. Lesion explored revealing no loculations. Irrigated with normal saline.  Packed with 1/4 inch plain packing.  Dressed. Wound care instructions including precautions with patient. Patient tolerated the procedure well. Recheck in 24 hours.   Right anterior thigh:  Verbal consent obtained. Risks and benefits of the procedure were explained to the patient. Patient made an informed decision to proceed with the procedure. Betadine prep per usual protocol. Local anesthesia obtained with lidocaine lidocaine with epi 3 cc.   Tissue debrided. Wound moderately deep. Unable to fully debride the wound secondary to patient comfort.  Culture previously taken by Dr. Milus Glazier.  Lesion explored revealing no loculations. Packed with 1/4 inch plain packing.  Dressed. Wound care instructions including precautions with patient. Patient tolerated the procedure well. Recheck in 24 hours to finish debridement. Stressed importance of follow up.   Signed, Eula Listen, PA-C [facility] [place], [place] [address] [phone] 07/17/2013 9:37 PM

## 2013-07-18 ENCOUNTER — Ambulatory Visit (INDEPENDENT_AMBULATORY_CARE_PROVIDER_SITE_OTHER): Payer: Managed Care, Other (non HMO) | Admitting: Physician Assistant

## 2013-07-18 VITALS — BP 132/90 | HR 95 | Temp 97.6°F | Resp 16 | Ht 65.0 in | Wt 206.0 lb

## 2013-07-18 DIAGNOSIS — IMO0002 Reserved for concepts with insufficient information to code with codable children: Secondary | ICD-10-CM

## 2013-07-18 DIAGNOSIS — L02419 Cutaneous abscess of limb, unspecified: Secondary | ICD-10-CM

## 2013-07-18 DIAGNOSIS — L03115 Cellulitis of right lower limb: Secondary | ICD-10-CM

## 2013-07-18 MED ORDER — ACETAMINOPHEN 325 MG PO TABS
1000.0000 mg | ORAL_TABLET | Freq: Every day | ORAL | Status: DC
  Administered 2013-07-18: 975 mg via ORAL

## 2013-07-18 NOTE — Progress Notes (Signed)
   Patient ID: TENSLEY WERY MRN: [id number], DOB: [birth year] 42 y.o. Date of Encounter: 07/18/2013, 9:33 AM  Primary Physician: No PCP Per Patient  Chief Complaint: Wound care   See previous note  HPI: 42 y.o. female presents for wound care s/p I&D of right lateral upper arm and debridement of right anterior thigh on 07/17/13.  Did well over night In short, we decided to stop her debridement of the right anterior thigh the previous evening secondary to her comfort level and continue with it this morning in follow up No issues or complaints Afebrile/ no chills No nausea or vomiting Tolerating doxycycline  Pain improved with Norco, last took a dose around 4 AM Still with original dressings in place Feels like she is improving   Past Medical History  Diagnosis Date  . Anemia      Home Meds: Prior to Admission medications   Medication Sig Start Date End Date Taking? Authorizing Provider  doxycycline (VIBRAMYCIN) 100 MG capsule Take 1 capsule (100 mg total) by mouth 2 (two) times daily. 07/17/13  Yes Kerianna Rawlinson M Sheretta Grumbine, PA-C  HYDROcodone-acetaminophen (NORCO/VICODIN) 5-325 MG per tablet Take 1 tablet by mouth every 6 (six) hours as needed for moderate pain. 07/17/13  Yes Myrl Lazarus M Somalia Segler, PA-C    Allergies: No Known Allergies  ROS: Constitutional: Afebrile, no chills Cardiovascular: negative for chest pain or palpitations Dermatological: Positive for wound, erythema, pain, and warmth GI: No nausea or vomiting   EXAM: Physical Exam: Blood pressure 132/90, pulse 95, temperature 97.6 F (36.4 C), temperature source Oral, resp. rate 16, height 5\' 5"  (1.651 m), weight 206 lb (93.441 kg), SpO2 96.00%., Body mass index is 34.28 kg/(m^2). General: Well developed, well nourished, in no acute distress. Nontoxic appearing. Head: Normocephalic, atraumatic, sclera non-icteric.  Neck: Supple. Lungs: Breathing is unlabored. Heart: Normal rate. Skin:  Warm and moist. Right lateral upper arm:  Dressing and packing in place. No induration, erythema, or tenderness to palpation. Right anterior thigh: Dressing and packing in place. Mild local induration. Surrounding soft tissue erythema has improved. No TTP.  Neuro: Alert and oriented X 3. Moves all extremities spontaneously. Normal gait.  Psych:  Responds to questions appropriately with a normal affect.   PROCEDURE: Right lateral upper arm: Dressing and packing removed No purulence expressed Wound bed healthy Wound depth less than length of incision Irrigated with 1% plain lidocaine 5 cc. No further packing Dressing applied  Right anterior thigh: Dressing and packing removed. 1% lidocaine with epi 5 cc for local anesthesia Extensive debridement continued to beefy red wound bed and wound edges Small amount of purulence expressed Irrigated with normal saline Repacked with 1/4 inch plain packing Dressing applied Patient tolerated procedure well  LAB: Culture: pending  A/P: 42 y.o. female with cellulitis/abscess as above s/p I&D of right lateral upper arm and debridement of right anterior thigh on 07/17/13. -Wound care per above -Continue doxycycline -Pain well controlled with Norco -Daily dressing changes -Recheck 48 hours, sooner if needed  Signed, Eula Listen, PA-C [facility] [place], [place] [address] [phone] 07/18/2013 9:33 AM

## 2013-07-20 ENCOUNTER — Ambulatory Visit (INDEPENDENT_AMBULATORY_CARE_PROVIDER_SITE_OTHER): Payer: Managed Care, Other (non HMO) | Admitting: Emergency Medicine

## 2013-07-20 VITALS — BP 120/70 | HR 99 | Temp 99.0°F | Resp 16 | Ht 65.0 in | Wt 210.4 lb

## 2013-07-20 DIAGNOSIS — Z4889 Encounter for other specified surgical aftercare: Secondary | ICD-10-CM

## 2013-07-20 LAB — WOUND CULTURE: Gram Stain: NONE SEEN

## 2013-07-20 NOTE — Progress Notes (Signed)
[facility] [address], [place] [address] [phone]  Date:  07/20/2013   Name:  GRADY LUCCI   DOB:  [birth year]   MRN:  [id number]  PCP:  No PCP Per Patient    Chief Complaint: Wound Check   History of Present Illness:  Waldine Zenz Delamater is a 42 y.o. very pleasant female patient who presents with the following:  Follow up for abscess thigh.  Overacker drainage.  No fever or chills.  No pain.  Denies other complaint or health concern today.   There are no active problems to display for this patient.   Past Medical History  Diagnosis Date  . Anemia     Past Surgical History  Procedure Laterality Date  . Brain surgery    . Cholecystectomy    . Abdominal hysterectomy      History  Substance Use Topics  . Smoking status: Never Smoker   . Smokeless tobacco: Not on file  . Alcohol Use: No    Family History  Problem Relation Age of Onset  . Hypertension Mother   . Hypertension Father   . Dementia Maternal Grandfather   . Hypertension Paternal Grandmother   . Hypertension Paternal Grandfather     No Known Allergies  Medication list has been reviewed and updated.  Current Outpatient Prescriptions on File Prior to Visit  Medication Sig Dispense Refill  . doxycycline (VIBRAMYCIN) 100 MG capsule Take 1 capsule (100 mg total) by mouth 2 (two) times daily.  20 capsule  0  . HYDROcodone-acetaminophen (NORCO/VICODIN) 5-325 MG per tablet Take 1 tablet by mouth every 6 (six) hours as needed for moderate pain.  30 tablet  0   Current Facility-Administered Medications on File Prior to Visit  Medication Dose Route Frequency Provider Last Rate Last Dose  . acetaminophen (TYLENOL) tablet 975 mg  975 mg Oral Daily Ryan M Dunn, PA-C   975 mg at 07/18/13 0831    Review of Systems:  As per HPI, otherwise negative.    Physical Examination: Filed Vitals:   07/20/13 1813  BP: 120/70  Pulse: 99  Temp: 99 F (37.2 C)  Resp: 16   Filed Vitals:   07/20/13 1813  Height: 5\' 5"  (1.651 m)  Weight: 210 lb 6.4 oz (95.437 kg)   Body mass index is 35.01 kg/(m^2). Ideal Body Weight: Weight in (lb) to have BMI = 25: 149.9   GEN: WDWN, NAD, Non-toxic, Alert & Oriented x 3 HEENT: Atraumatic, Normocephalic.  Ears and Nose: No external deformity. EXTR: No clubbing/cyanosis/edema NEURO: Normal gait.  PSYCH: Normally interactive. Conversant. Not depressed or anxious appearing.  Calm demeanor.  Right leg:  Ulceration right distal thigh.  No drainage.  Assessment and Plan: Wound redressing No further office based care required.  Signed,  Phillips Odor, MD

## 2013-09-01 ENCOUNTER — Ambulatory Visit: Payer: Managed Care, Other (non HMO)

## 2013-09-01 ENCOUNTER — Ambulatory Visit (INDEPENDENT_AMBULATORY_CARE_PROVIDER_SITE_OTHER): Payer: Managed Care, Other (non HMO) | Admitting: Emergency Medicine

## 2013-09-01 VITALS — BP 174/108 | HR 94 | Temp 98.9°F | Resp 16 | Ht 64.5 in | Wt 205.6 lb

## 2013-09-01 DIAGNOSIS — M79645 Pain in left finger(s): Secondary | ICD-10-CM

## 2013-09-01 DIAGNOSIS — L02419 Cutaneous abscess of limb, unspecified: Secondary | ICD-10-CM

## 2013-09-01 DIAGNOSIS — L03115 Cellulitis of right lower limb: Secondary | ICD-10-CM

## 2013-09-01 DIAGNOSIS — M79609 Pain in unspecified limb: Secondary | ICD-10-CM

## 2013-09-01 MED ORDER — HYDROCODONE-ACETAMINOPHEN 5-325 MG PO TABS: 1.0000 | ORAL_TABLET | Freq: Four times a day (QID) | ORAL | Status: DC | PRN

## 2013-09-01 MED ORDER — DOXYCYCLINE HYCLATE 100 MG PO CAPS: 100.0000 mg | ORAL_CAPSULE | Freq: Two times a day (BID) | ORAL | Status: DC

## 2013-09-01 NOTE — Patient Instructions (Signed)
Please soak your finger in Epsom salts twice a day. return to clinic on Monday for a recheck.

## 2013-09-01 NOTE — Progress Notes (Addendum)
Subjective:    Patient ID: Lisa Cross, female    DOB: 10-08-1970, 42 y.o.   MRN: [id number]  HPI  This chart was scribed for Viviann Spare A. Cleta Alberts, MD, by Ellin Mayhew, ED Scribe. This patient was seen in room 12 and the patient's care was started at 9:56 AM.  HPI Comments: Lisa Cross is a 42 y.o. female who [facility] the [facility] complaining of a L hand injury to the third finger. Patient reports that yesterday she slammed her finger in the microwave accidentally. Currently, she [facility] to the [facility] with constant swelling and pain to the finger. Patient has had a hysterectomy in the past. 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 She denies any other symptoms currently.   Past Medical History  Diagnosis Date  . Anemia     Past Surgical History  Procedure Laterality Date  . Brain surgery    . Cholecystectomy    . Abdominal hysterectomy      Family History  Problem Relation Age of Onset  . Hypertension Mother   . Hypertension Father   . Dementia Maternal Grandfather   . Hypertension Paternal Grandmother   . Hypertension Paternal Grandfather     History   Social History  . Marital Status: Married    Spouse Name: N/A    Number of Children: N/A  . Years of Education: N/A   Occupational History  . Not on file.   Social History Main Topics  . Smoking status: Never Smoker   . Smokeless tobacco: Not on file  . Alcohol Use: No  . Drug Use: No  . Sexual Activity: Yes    Birth Control/ Protection: Condom   Other Topics Concern  . Not on file   Social History Narrative  . No narrative on file    No Known Allergies  There are no active problems to display for this patient.   Results for orders placed in visit on 07/17/13  WOUND CULTURE      Result Value Range   Culture       Value: Abundant METHICILLIN RESISTANT STAPHYLOCOCCUS AUREUS   Gram Stain Rare     Gram Stain WBC  present-both PMN and Mononuclear     Gram Stain No Squamous Epithelial Cells Seen     Gram Stain Abundant Gram Positive Cocci In Pairs In Clusters     Organism ID, Bacteria METHICILLIN RESISTANT STAPHYLOCOCCUS AUREUS      No diagnosis found.  No orders of the defined types were placed in this encounter.    BP 174/108  Pulse 94  Temp(Src) 98.9 F (37.2 C) (Oral)  Resp 16  Ht 5' 4.5" (1.638 m)  Wt 205 lb 9.6 oz (93.26 kg)  BMI 34.76 kg/m2  SpO2 98%   Review of Systems  Constitutional: Negative for fever and chills.  HENT: Negative for congestion and sore throat.   Respiratory: Negative for shortness of breath.   Gastrointestinal: Negative for nausea and vomiting.  Musculoskeletal: Positive for joint swelling.       L hand injury to the 3rd finger  Neurological: Negative for weakness.    A complete 10 system review of systems was obtained and all systems are negative except as noted in the HPI and PMH.   Objective:   Physical Exam  CONSTITUTIONAL: Well developed/well nourished HEAD: Normocephalic/atraumatic EYES: EOMI/PERRL ENMT: Mucous membranes moist NECK: supple no meningeal  signs SPINE:entire spine nontender CV: S1/S2 noted, no murmurs/rubs/gallops noted LUNGS: Lungs are clear to auscultation bilaterally, no apparent distress ABDOMEN: soft, nontender, no rebound or guarding GU:no cva tenderness NEURO: Pt is awake/alert, moves all extremitiesx4 EXTREMITIES: Injury to the L hand; third finger. Swelling noted and injury to the nail. Pulses normal, full ROM there is discoloration beneath the nail. There is diffuse redness which extends from the PIP joint distally. There is significant swelling of the fat pad of the distal phalanx of the  left third finger. SKIN: warm, color normal PSYCH: no abnormalities of mood noted [facility] reading (PRIMARY) by  Dr. Cleta Alberts there is a boutonniere deformity of the middle finger without definite fracture A 20-gauge needle was used to place  a hole in the midportion of the nail plate. This returned approximately 2 cc of a purulent bloody material.  Assessment & Plan:  Patient had an injury to the middle finger. She developed a subungual hematoma with secondary infection. She has a history of MRSA. We'll treat with doxycycline twice a day splint recheck in 48 hours. She did not want her nail plate removed at the present time.

## 2013-09-03 ENCOUNTER — Telehealth: Payer: Self-pay

## 2013-09-03 LAB — WOUND CULTURE

## 2013-09-03 NOTE — Telephone Encounter (Signed)
Patient calling back for lab results, please call back  [phone]

## 2013-09-04 NOTE — Telephone Encounter (Signed)
Culture positive for MRSA. Continue doxycycline Called again to advise, left another message for call back. See lab.

## 2013-11-16 ENCOUNTER — Other Ambulatory Visit: Payer: Self-pay | Admitting: Family Medicine

## 2013-11-16 DIAGNOSIS — Z1231 Encounter for screening mammogram for malignant neoplasm of breast: Secondary | ICD-10-CM

## 2013-12-17 ENCOUNTER — Ambulatory Visit: Payer: BC Managed Care – PPO | Admitting: Internal Medicine

## 2013-12-17 VITALS — BP 150/90 | HR 102 | Temp 98.7°F | Resp 18 | Ht 64.5 in | Wt 212.0 lb

## 2013-12-17 DIAGNOSIS — H60399 Other infective otitis externa, unspecified ear: Secondary | ICD-10-CM

## 2013-12-17 MED ORDER — NEOMYCIN-POLYMYXIN-HC 3.5-10000-1 OT SUSP: 3.0000 [drp] | Freq: Four times a day (QID) | OTIC | Status: DC

## 2013-12-17 MED ORDER — CEPHALEXIN 500 MG PO CAPS: 500.0000 mg | ORAL_CAPSULE | Freq: Three times a day (TID) | ORAL | Status: DC

## 2013-12-17 NOTE — Progress Notes (Signed)
   Subjective:    Patient ID: Lisa Cross, female    DOB: [birth year], 43 y.o.   MRN: [id number] This chart was scribed for Ellamae Siaobert Marieke Lubke, MD by Nicholos Johnsenise Iheanachor, Medical Scribe. This patient's care was started at 7:40 PM.  Otalgia  Pertinent negatives include no rhinorrhea or sore throat.   HPI Comments: Lisa Cross is a 43 y.o. female who presents [facility] the [facility] complaining of right ear pain w/ some hearing loss, onset 3 days ago. Pt says she is able to hear but with some difficulty. Deniwa congestion, rhinorrhea, or sore throat.  Review of Systems  HENT: Positive for ear pain. Negative for congestion, rhinorrhea and sore throat.    Objective:  Physical Exam  Vitals reviewed. Constitutional: She is oriented to person, place, and time. She appears well-developed and well-nourished. No distress.  HENT:  Head: Normocephalic and atraumatic.  Left Ear: External ear normal.  Right ear canal is red and swollen with pus obscuring the tympanic membrane. Right anterior cervical node at the angle of the jaw that is tender.  Eyes: EOM are normal.  Neck: Neck supple. No thyromegaly present.  Cardiovascular: Normal rate.   Pulmonary/Chest: Effort normal. No respiratory distress.  Musculoskeletal: Normal range of motion.  Lymphadenopathy:    She has cervical adenopathy.  Neurological: She is alert and oriented to person, place, and time.  Skin: Skin is warm and dry.  Psychiatric: She has a normal mood and affect. Her behavior is normal.   Assessment & Plan:  I have completed the patient encounter in its entirety as documented by the scribe, with editing by me where necessary. Bettye Sitton P. Merla Richesoolittle, M.D.  Otitis, externa, infective  Meds ordered this encounter  Medications  . neomycin-polymyxin-hydrocortisone (CORTISPORIN) 3.5-10000-1 otic suspension    Sig: Place 3 drops into the left ear 4 (four) times daily.    Dispense:  10 mL    Refill:  0  . cephALEXin  (KEFLEX) 500 MG capsule    Sig: Take 1 capsule (500 mg total) by mouth 3 (three) times daily.    Dispense:  21 capsule    Refill:  0

## 2013-12-27 ENCOUNTER — Ambulatory Visit (HOSPITAL_COMMUNITY): Payer: BC Managed Care – PPO

## 2013-12-27 ENCOUNTER — Ambulatory Visit (HOSPITAL_COMMUNITY)
Admission: RE | Admit: 2013-12-27 | Discharge: 2013-12-27 | Disposition: A | Discharge status: Home / Self Care | Payer: BC Managed Care – PPO | Source: Ambulatory Visit | Attending: Family Medicine | Admitting: Family Medicine

## 2013-12-27 DIAGNOSIS — Z1231 Encounter for screening mammogram for malignant neoplasm of breast: Secondary | ICD-10-CM | POA: Insufficient documentation

## 2013-12-28 ENCOUNTER — Ambulatory Visit (HOSPITAL_COMMUNITY): Payer: Managed Care, Other (non HMO)

## 2014-06-06 ENCOUNTER — Ambulatory Visit (INDEPENDENT_AMBULATORY_CARE_PROVIDER_SITE_OTHER): Payer: BC Managed Care – PPO

## 2014-06-06 ENCOUNTER — Ambulatory Visit (INDEPENDENT_AMBULATORY_CARE_PROVIDER_SITE_OTHER): Payer: BC Managed Care – PPO | Admitting: Family Medicine

## 2014-06-06 VITALS — BP 154/98 | HR 95 | Temp 97.6°F | Resp 16 | Ht 65.0 in | Wt 219.2 lb

## 2014-06-06 DIAGNOSIS — M25561 Pain in right knee: Secondary | ICD-10-CM

## 2014-06-06 DIAGNOSIS — S86911A Strain of unspecified muscle(s) and tendon(s) at lower leg level, right leg, initial encounter: Secondary | ICD-10-CM

## 2014-06-06 DIAGNOSIS — S86811A Strain of other muscle(s) and tendon(s) at lower leg level, right leg, initial encounter: Secondary | ICD-10-CM

## 2014-06-06 MED ORDER — MELOXICAM 15 MG PO TABS: 15.0000 mg | ORAL_TABLET | Freq: Every day | ORAL | Status: DC

## 2014-06-06 MED ORDER — DICLOFENAC SODIUM 1 % TD GEL: 2.0000 g | Freq: Four times a day (QID) | TRANSDERMAL | Status: DC

## 2014-06-06 NOTE — Progress Notes (Addendum)
Subjective:    Patient ID: Lisa Cross, female    DOB: 08/19/1971, 43 y.o.   MRN: [id number] This chart was scribed for Nilda Simmer, MD by Evon Slack, ED Scribe. This Patient was seen in room 13 and the patients care was started at 5:36 PM   06/06/2014  Knee Pain   Knee Pain    HPI Comments: Lisa Cross is a 43 y.o. female who presents [facility] the [facility] complaining of chronic right knee pain onset years prior. She states that it recently started worsening over the past 5 days ago. She states that it feels like at times her knee is going to give out on her. She states that climbing up a ladder at work makes her symptoms worse. 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 She also states that at times she notices pain when resting. She states she has been applying ice and heat with no relief. She also states she has been applying a topical spray with some relief. She denies taking any medications prior to arrival.  Denies any past surgeries to the knee. She denies any swelling to the knee.  +chronic popping to knee.  Chronic intermittent knee pain; no previous ortho consultation.  Review of Systems  Constitutional: Negative for fever, chills, diaphoresis and fatigue.  Musculoskeletal: Positive for arthralgias (right knee pain) and gait problem. Negative for joint swelling.  Skin: Negative for rash.    Past Medical History  Diagnosis Date   Anemia    Past Surgical History  Procedure Laterality Date   Brain surgery     Cholecystectomy     Abdominal hysterectomy     No Known Allergies Current Outpatient Prescriptions  Medication Sig Dispense Refill   diclofenac sodium (VOLTAREN) 1 % GEL Apply 2 g topically 4 (four) times daily.  100 g  1   meloxicam (MOBIC) 15 MG tablet Take 1 tablet (15 mg total) by mouth daily.  30 tablet  0   Current Facility-Administered Medications  Medication Dose Route Frequency Provider  Last Rate Last Dose   acetaminophen (TYLENOL) tablet 975 mg  975 mg Oral Daily Ryan M Dunn, PA-C   975 mg at 07/18/13 0831       Objective:    BP 154/98   Pulse 95   Temp(Src) 97.6 F (36.4 C) (Oral)   Resp 16   Ht 5\' 5"  (1.651 m)   Wt 219 lb 3.2 oz (99.428 kg)   BMI 36.48 kg/m2   SpO2 98%  Physical Exam  Nursing note and vitals reviewed. Constitutional: She is oriented to person, place, and time. She appears well-developed and well-nourished. No distress.  HENT:  Head: Normocephalic and atraumatic.  Eyes: Conjunctivae and EOM are normal.  Neck: Neck supple.  Cardiovascular: Normal rate.   Pulses:      Dorsalis pedis pulses are 2+ on the right side, and 2+ on the left side.  Pulmonary/Chest: Effort normal. No respiratory distress.  Musculoskeletal:       Right knee: She exhibits decreased range of motion. She exhibits no swelling, no effusion, no ecchymosis, no deformity, no laceration, no erythema, normal alignment, no LCL laxity, normal patellar mobility, no bony tenderness and normal meniscus. Tenderness found. No medial joint line, no lateral joint line, no MCL, no LCL and no patellar tendon tenderness noted.  Right knee: No patella tenderness, tenderness to palpation just lateral and medial to patella, no joint  line tenderness, pain with flexion but normal extension without pain, negative Lachman, anterior draw negative, no swelling noted, calf non-tender, Mcmurray's equivalent.   Neurological: She is alert and oriented to person, place, and time.  Skin: Skin is warm and dry.  Psychiatric: She has a normal mood and affect. Her behavior is normal.   [facility] reading (PRIMARY) by  Dr. Katrinka BlazingSmith. R KNEE: NAD: OLD AVULSION FRACTURE TIBIA; NO JOINT EFFUSION.Marland Kitchen.     Assessment & Plan:   1. Pain in right knee   2. Strain of knee, right, initial encounter     1.  R knee pain/R knee strain: New.  Recommend rest, icing, elevation. Rx for Voltaren gel provided; if insurance does not cover, rx  for Mobic provided. Refer to ortho due to chronic nature of knee pain. Home exercise program provided. Pt declined hinged knee brace; not effective in the past.    Meds ordered this encounter  Medications   meloxicam (MOBIC) 15 MG tablet    Sig: Take 1 tablet (15 mg total) by mouth daily.    Dispense:  30 tablet    Refill:  0   diclofenac sodium (VOLTAREN) 1 % GEL    Sig: Apply 2 g topically 4 (four) times daily.    Dispense:  100 g    Refill:  1    No Follow-up on file.    I personally performed the services described in this documentation, which was scribed in my presence.  The recorded information has been reviewed and is accurate.  Nilda SimmerKristi Smith, M.D.  [facility]   [place] [address] [place], [place]  [id number] [phone] phone [phone] fax

## 2014-06-06 NOTE — Patient Instructions (Signed)
Lateral Collateral Knee Ligament Sprain with Phase I Rehab The lateral collateral ligament (LCL) of the knee helps hold the knee joint in proper alignment and prevents the bones from shifting out of alignment (displacing) toward the outside (laterally). Injury to the knee may cause a tear in the LCL ligament (sprain). The LCL is the least common ligament of the knee to be injured. Sprains may heal on their own, but they often result in a loose joint. Sprains are classified into three categories. Grade 1 sprains cause pain, but the tendon is not lengthened. Grade 2 sprains include a lengthened ligament, due to the ligament being stretched or partially ruptured. With grade 2 sprains there is still function, although the function may be decreased. Grade 3 sprains involve a complete tear of the tendon or muscle, and function is usually impaired. SYMPTOMS   Pain and tenderness on the outer side of the knee.  A "pop," tearing, or pulling sensation at the time of injury.  Bruising (contusion) at the site of injury within 48 hours of injury.  Knee stiffness.  Limping, often walking with the knee bent. CAUSES  An LCL sprain occurs when a force is placed on the ligament that is greater than it can handle. Common causes of injury include:  Direct hit (trauma) to the inner side of the knee, especially if the foot is planted on the ground.  Forceful pivoting of the body and leg while the foot is planted on the ground. RISK INCREASES WITH:  Contact sports (football, rugby).  Sports that require pivoting or cutting (soccer).  Poor knee strength and flexibility.  Improper equipment use. PREVENTION   Warm up and stretch properly before activity.  Maintain physical fitness:  Strength, flexibility, and endurance.  Cardiovascular fitness.  Wear properly fitted protective equipment (correct length of cleats for surface).  Functional braces may be effective in preventing injury. PROGNOSIS  If  treated properly, LCL tears usually heal on their own. Sometimes, surgery is required. RELATED COMPLICATIONS   Frequently recurring symptoms, such as knee giving way, instability, and swelling.  Injury to other structures in the knee joint.  Meniscal cartilage, resulting in locking and swelling of the knee.  Articular cartilage, resulting in knee arthritis.  Other ligaments of the knee (commonly).  Injury to nerves, causing numbness of the outer leg, foot, and ankle and weakness or paralysis, with inability to raise the ankle, big toe, or lesser toes.  Knee stiffness (loss of knee motion). TREATMENT  Treatment first involves the use of ice and medicine to reduce pain and inflammation. The use of strengthening and stretching exercises may help reduce pain with activity. These exercises may be performed at home, but referral to a therapist is often advised. You may be advised to walk with crutches until you are able to walk without a limp. Your caregiver may provide you with a hinged knee brace to help regain a full range of motion while also protecting the injured knee. For severe LCL injuries, or injuries that involve other ligaments of the knee, surgery is often advised. MEDICATION   If pain medicine is needed, nonsteroidal anti-inflammatory medicines (aspirin and ibuprofen), or other minor pain relievers (acetaminophen), are often advised.  Do not take pain medicine for 7 days before surgery.  Prescription pain relievers may be given, if your caregiver thinks they are needed. Use only as directed and only as much as you need. HEAT AND COLD  Cold treatment (icing) should be applied for 10 to 15 minutes   every 2 to 3 hours for inflammation and pain, and immediately after activity that aggravates your symptoms. Use ice packs or an ice massage.  Heat treatment may be used before performing stretching and strengthening activities prescribed by your caregiver, physical therapist, or athletic  trainer. Use a heat pack or a warm water soak. SEEK MEDICAL CARE IF:   Symptoms get worse or do not improve in 4 to 6 weeks, despite treatment.  New, unexplained symptoms develop. (Drugs used in treatment may produce side effects.) EXERCISES RANGE OF MOTION (ROM) AND STRETCHING EXERCISES - Lateral Collateral Knee Ligament Sprain Phase I These are some of the initial exercises that your physician, physical therapist or athletic trainer may have you perform to begin your rehabilitation. When you demonstrate gains in your flexibility and strength, your caregiver may progress you to Phase II exercises. As you perform these exercises, remember:   These initial exercises are intended to be gentle. They will help you restore motion without increasing any swelling.  Completing these exercises allows less painful movement and prepares you for the more aggressive strengthening exercises in Phase II.  An effective stretch should be held for at least 30 seconds.  A stretch should never be painful. You should only feel a gentle lengthening or release in the stretched tissue. RANGE OF MOTION - Knee Flexion, Active  Lie on your back with both knees straight. (If this causes back discomfort, bend your opposite knee, placing your foot flat on the floor.)  Slowly slide your heel back toward your buttocks until you feel a gentle stretch in the front of your knee or thigh.  Hold for __________ seconds. Slowly slide your heel back to the starting position. Repeat __________ times. Complete this exercise __________ times per day.  STRETCH - Knee Flexion, Supine  Lie on the floor with your right / left heel and foot lightly touching the wall. (Place both feet on the wall, if you do not use a door frame.)  Without using any effort, allow gravity to slide your foot down the wall slowly until you feel a gentle stretch in the front of your right / left knee.  Hold this stretch for __________ seconds. Then return  the leg to the starting position, using your healthy leg for help, if needed. Repeat __________ times. Complete this stretch __________ times per day.  RANGE OF MOTION - Knee Flexion and Extension, Active-Assisted  Sit on the edge of a table or chair with your thighs firmly supported. It may be helpful to place a folded towel under the end of your right / left thigh.  Flexion (bending): Place the ankle of your healthy leg on top of the other ankle. Use your healthy leg to gently bend your right / left knee until you feel a mild tension across the top of your knee.  Hold for __________ seconds.  Extension (straightening): Switch your ankles so your right / left leg is on top. Use your healthy leg to straighten your right / left knee until you feel a mild tension on the backside of your knee.  Hold for __________ seconds. Repeat __________ times. Complete this exercise __________ times per day. STRETCH - Knee Extension Sitting  Sit with your right / left leg/heel propped on another chair, coffee table, or foot stool.  Allow your leg muscles to relax, letting gravity straighten out your knee.*  You should feel a stretch behind your right / left knee. Hold this position for __________ seconds. Repeat __________ times.   Complete this stretch __________ times per day.  *Your physician, physical therapist, or athletic trainer may instruct you place a __________ weight on your thigh, just above your kneecap, to deepen the stretch.  STRENGTHENING EXERCISES Lateral Collateral Knee Ligament Sprain - Phase I These exercises may help you when beginning to rehabilitate your injury. They may resolve your symptoms with or without further involvement from your physician, physical therapist, or athletic trainer. While completing these exercises, remember:   Muscles can gain both the endurance and the strength needed for everyday activities through controlled exercises.  Complete these exercises as  instructed by your physician, physical therapist or athletic trainer. Increase the resistance and repetitions only as guided.  In order to return to more demanding activities, you will likely need to progress to more challenging exercises. Your physician, physical therapist or athletic trainer will advance your exercises when your tissues show adequate healing and your muscles demonstrate increased strength. STRENGTH - Quadriceps, Isometrics  Lie on your back with your right / left leg extended and your opposite knee bent.  Gradually tense the muscles in the front of your right / left thigh. You should see either your kneecap slide up toward your hip or increased dimpling just above the knee. This motion will push the back of the knee down toward the floor, mat, or bed on which you are lying.  Hold the muscle as tight as you can without increasing your pain for __________ seconds.  Relax the muscles slowly and completely between each repetition. Repeat __________ times. Complete this exercise __________ times per day.  STRENGTH - Quadriceps, Short Arcs   Lie on your back. Place a __________ inch towel roll under your right / left knee, so that the knee bends slightly.  Raise only your lower leg by tightening the muscles in the front of your thigh. Do not allow your thigh to rise.  Hold this position for __________ seconds. Repeat __________ times. Complete this exercise __________ times per day.  OPTIONAL ANKLE WEIGHTS: Begin with ____________________, but DO NOT exceed ____________________. Increase in 1 pound/0.5 kilogram increments. STRENGTH - Quadriceps, Straight Leg Raises  Quality counts! Watch for signs that the quadriceps muscle is working, to be sure you are strengthening the correct muscles and not "cheating" by substituting with healthier muscles.  Lie on your back with your right / left leg extended and your opposite knee bent.  Tense the muscles in the front of your right /  left thigh. You should see either your kneecap slide up or increased dimpling just above the knee. Your thigh may even shake a bit.  Tighten these muscles even more and raise your leg 4 to 6 inches off the floor. Hold for __________ seconds.  Keeping these muscles tense, lower your leg.  Relax the muscles slowly and completely in between each repetition. Repeat __________ times. Complete this exercise __________ times per day.  STRENGTH - Hamstring, Isometrics   Lie on your back, on a firm surface.  Bend your right / left knee approximately __________ degrees.  Dig your heel into the surface as if you are trying to pull it toward your buttocks. Tighten the muscles in the back of your thighs to "dig" as hard as you can, without increasing any pain.  Hold this position for __________ seconds.  Release the tension gradually and allow your muscle to completely relax for __________ seconds in between each exercise. Repeat __________ times. Complete this exercise __________ times per day.  STRENGTH - Hamstring,   Curls   Lie on your stomach with your legs extended. (If you lie on a bed, your feet may hang over the edge.)  Tighten the muscles in the back of your thigh to bend your right / left knee up to 90 degrees. Keep your hips flat on the bed.  Hold this position for __________ seconds.  Slowly lower your leg back to the starting position. Repeat __________ times. Complete this exercise __________ times per day.  OPTIONAL ANKLE WEIGHTS: Begin with ____________________, but DO NOT exceed ____________________. Increase in 1 pound/0.5 kilogram increments. Document Released: 08/23/2005 Document Revised: 01/07/2014 Document Reviewed: 12/05/2008 [employer] Patient Information 2015 [employer]. This information is not intended to replace advice given to you by your health care provider. Make sure you discuss any questions you have with your health care provider.  

## 2014-06-08 ENCOUNTER — Telehealth: Payer: Self-pay

## 2014-06-08 NOTE — Telephone Encounter (Signed)
Patient came in to [facility] today and dropped off a form for her handicap placecard. Patient would like Dr. Katrinka BlazingSmith to complete the form and for someone to call her when it is ready for pick-up. CB # [phone]  Placed the form in Dr. Michaelle CopasSmith's box 06/08/14. Thank you

## 2014-06-12 NOTE — Telephone Encounter (Signed)
LM for pt the placard paperwork is in Dr. Michaelle CopasSmith's box

## 2014-06-12 NOTE — Telephone Encounter (Signed)
Patient called to check the status of her handicap placard. States that she needs it by the end of the month.

## 2014-06-13 ENCOUNTER — Telehealth: Payer: Self-pay

## 2014-06-13 NOTE — Telephone Encounter (Signed)
Call --- her handicap placard is for a permanent handicap sticker; I am happy to complete a temporary handicap sticker since I have only seen her once for knee pain and since I have referred her to orthopedics.  I am not comfortable with completing a permanent handicap sticker.

## 2014-06-13 NOTE — Telephone Encounter (Signed)
Patient called to check the status of her handicap form if it was ready. I let patient know it was in Dr. Michaelle CopasSmith's box waiting to be signed off by her and once it was we would contact her. Please call patient at [phone] when ready for pick up.

## 2014-06-17 NOTE — Telephone Encounter (Signed)
Filled out temp [facility] for the [facility]- Pt is going to specialist tomorrow and would like a one month temp parking sticker. Pt also requested a permanent one to have Ortho fill out- Dr. Katrinka BlazingSmith signed and in pick up drawer.

## 2014-06-17 NOTE — Telephone Encounter (Signed)
Please call patient when sticker is ready.  [phone]

## 2014-06-17 NOTE — Telephone Encounter (Signed)
Patient called to check the status of her form. Patient had not received a phone call from a clinical staff member regarding the temporary handicap sticker (see previous phone message from Dr. Katrinka BlazingSmith). How does she get the temporary sticker and should the orthopedic specialist complete her permanent sticker?   [phone]

## 2014-06-18 NOTE — Telephone Encounter (Signed)
Will not sign permanent handicap sticker; signed temporary handicap sticker for 30 days.

## 2014-07-03 ENCOUNTER — Other Ambulatory Visit: Payer: Self-pay | Admitting: Family Medicine

## 2015-03-04 ENCOUNTER — Other Ambulatory Visit: Payer: Self-pay | Admitting: Family Medicine

## 2015-03-04 DIAGNOSIS — Z1231 Encounter for screening mammogram for malignant neoplasm of breast: Secondary | ICD-10-CM

## 2015-03-05 ENCOUNTER — Ambulatory Visit (HOSPITAL_COMMUNITY)
Admission: RE | Admit: 2015-03-05 | Discharge: 2015-03-05 | Disposition: A | Discharge status: Home / Self Care | Payer: BLUE CROSS/BLUE SHIELD | Source: Ambulatory Visit | Attending: Family Medicine | Admitting: Family Medicine

## 2015-03-05 DIAGNOSIS — Z1231 Encounter for screening mammogram for malignant neoplasm of breast: Secondary | ICD-10-CM | POA: Diagnosis not present

## 2015-05-25 ENCOUNTER — Ambulatory Visit (INDEPENDENT_AMBULATORY_CARE_PROVIDER_SITE_OTHER): Payer: BLUE CROSS/BLUE SHIELD | Admitting: Physician Assistant

## 2015-05-25 VITALS — BP 140/100 | HR 93 | Temp 98.2°F | Resp 18 | Ht 65.75 in | Wt 218.4 lb

## 2015-05-25 DIAGNOSIS — M25561 Pain in right knee: Secondary | ICD-10-CM | POA: Diagnosis not present

## 2015-05-25 DIAGNOSIS — S86811A Strain of other muscle(s) and tendon(s) at lower leg level, right leg, initial encounter: Secondary | ICD-10-CM

## 2015-05-25 DIAGNOSIS — S86911A Strain of unspecified muscle(s) and tendon(s) at lower leg level, right leg, initial encounter: Secondary | ICD-10-CM

## 2015-05-25 MED ORDER — MELOXICAM 15 MG PO TABS: 15.0000 mg | ORAL_TABLET | Freq: Every day | ORAL | Status: DC

## 2015-05-25 NOTE — Patient Instructions (Signed)
I have given you a 30 day temporary placard.  For an extension of this you will need to see ortho to have them examine you and determine the best treatment.  I have put the ortho referral in for you.  Please take the mobic once daily for 2 weeks for pain and inflammation.  Resting and icing the knee will help over the next 1-2 weeks.

## 2015-05-25 NOTE — Progress Notes (Signed)
   Subjective:    Patient ID: Lisa Cross, female    DOB: 18-Dec-1970, 44 y.o.   MRN: [id number]  Chief Complaint  Patient presents with  . Knee Pain    C/O right knee pain (chronic)-worse after excersing today on the eliptical   Medications, allergies, past medical history, surgical history, family history, social history and problem list reviewed and updated.  HPI  44 yof presents with right knee pain and requesting handicap placard.   Was seen by [place] one year ago for similar complaints. 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 She was referred to ortho.   Today she states she had been ok since last year, with occasional flares of right knee pain. Per pt she did see ortho and had an mri, they recommended surgery to "clean it out" but she declined.   She was feeling ok till earlier this am while on the ellipitical had some mild medial right knee pain. Has been persistent, 3/10. Knee has been popping past few days. Sometimes locks up on her past few days. Denies trauma. Not taking anything for pain.   She is requesting placard as she states the knee locks up at times and prevents her from walking long distances.   Review of Systems No fevers, chills.     Objective:   Physical Exam  Constitutional: She appears well-developed and well-nourished.  Non-toxic appearance. She does not have a sickly appearance. She does not appear ill. No distress.  BP 140/100 mmHg  Pulse 93  Temp(Src) 98.2 F (36.8 C) (Oral)  Resp 18  Ht 5' 5.75" (1.67 m)  Wt 218 lb 6 oz (99.054 kg)  BMI 35.52 kg/m2  SpO2 93%   Musculoskeletal:       Right knee: She exhibits normal range of motion, no swelling, no effusion, no LCL laxity, normal patellar mobility, no bony tenderness, normal meniscus and no MCL laxity. Tenderness found. Medial joint line tenderness noted. No lateral joint line, no MCL, no LCL and no patellar tendon tenderness noted.        Assessment & Plan:   Right knee pain - Plan: meloxicam (MOBIC) 15 MG tablet, Ambulatory referral to Orthopedic Surgery, CANCELED: DG Knee Complete 4 Views Right  Strain of knee, right, initial encounter --likely knee strain, possible medial meniscus pathology, referral placed for ortho --mobic qd, RICE --placard given for one month, instructed pt she will need extensions from ortho if she requests them, she expressed understanding  Donnajean Lopes, PA-C Physician Assistant-Certified [facility]  05/25/2015 5:08 PM

## 2015-06-10 ENCOUNTER — Telehealth: Payer: Self-pay

## 2015-06-10 NOTE — Telephone Encounter (Signed)
Patient is requesting a copy of her last x-ray.  [phone]

## 2015-06-11 NOTE — Telephone Encounter (Signed)
Sent to Xray

## 2015-08-28 DIAGNOSIS — I10 Essential (primary) hypertension: Secondary | ICD-10-CM | POA: Insufficient documentation

## 2015-08-28 DIAGNOSIS — R739 Hyperglycemia, unspecified: Secondary | ICD-10-CM | POA: Insufficient documentation

## 2015-08-28 DIAGNOSIS — E1165 Type 2 diabetes mellitus with hyperglycemia: Secondary | ICD-10-CM | POA: Insufficient documentation

## 2016-02-06 ENCOUNTER — Ambulatory Visit (INDEPENDENT_AMBULATORY_CARE_PROVIDER_SITE_OTHER): Payer: Self-pay | Admitting: Emergency Medicine

## 2016-02-06 VITALS — BP 156/80 | HR 99 | Temp 98.1°F | Resp 16 | Ht 65.0 in | Wt 215.0 lb

## 2016-02-06 DIAGNOSIS — I10 Essential (primary) hypertension: Secondary | ICD-10-CM

## 2016-02-06 DIAGNOSIS — Z0282 Encounter for adoption services: Secondary | ICD-10-CM

## 2016-02-06 DIAGNOSIS — R030 Elevated blood-pressure reading, without diagnosis of hypertension: Secondary | ICD-10-CM

## 2016-02-06 DIAGNOSIS — Z029 Encounter for administrative examinations, unspecified: Secondary | ICD-10-CM

## 2016-02-06 DIAGNOSIS — R739 Hyperglycemia, unspecified: Secondary | ICD-10-CM | POA: Insufficient documentation

## 2016-02-06 DIAGNOSIS — IMO0001 Reserved for inherently not codable concepts without codable children: Secondary | ICD-10-CM

## 2016-02-06 LAB — GLUCOSE, POCT (MANUAL RESULT ENTRY): POC Glucose: 126 mg/dl — AB (ref 70–99)

## 2016-02-06 NOTE — Addendum Note (Signed)
Addended by: Lesle ChrisAUB, STEVEN A on: 02/06/2016 02:56 PM   Modules accepted: Level of Service

## 2016-02-06 NOTE — Progress Notes (Signed)
Patient ID: Lisa Cross, female   DOB: 10/25/1970, 45 y.o.   MRN: [id number]    By signing my name below, I, Essence Howell, attest that this documentation has been prepared under the direction and in the presence of Collene Gobble, MD Electronically Signed: Charline Bills, ED Scribe 02/06/2016 at 2:02 PM.  Chief Complaint:  Chief Complaint  Patient presents with  . other    pt needs paper work filled out for work/ tb screen and medical clearance   HPI: Lisa Cross is a 45 y.o. female, with a h/o HTN, who reports to [facility] today for a TB screen for a foster care agency. Pt has already gotten a 91 day old foster child through the agency. She also has a 45 y.o daughter. Pt denies alcohol or illicit drug use. She also denies mental or behavioral health issues. No exposure to TB or h/o a positive TB test.   HTN Pt states that she is aware of her elevated BP but does not want treatment. She states that she would prefer to change her diet and exercise to manage her BP. Triage BP: 156/80. Repeat BP on the left: 184/114 and 170/110 on the right.   Past Medical History  Diagnosis Date  . Anemia    Past Surgical History  Procedure Laterality Date  . Brain surgery    . Cholecystectomy    . Abdominal hysterectomy     Social History   Social History  . Marital Status: Married    Spouse Name: N/A  . Number of Children: N/A  . Years of Education: N/A   Social History Main Topics  . Smoking status: Never Smoker   . Smokeless tobacco: Never Used  . Alcohol Use: No  . Drug Use: No  . Sexual Activity: Yes    Birth Control/ Protection: Condom   Other Topics Concern  . None   Social History Narrative   Family History  Problem Relation Age of Onset  . Hypertension Mother   . Hypertension Father   . Dementia Maternal Grandfather   . Hypertension Paternal Grandmother   . Hypertension Paternal Grandfather    No Known Allergies Prior to Admission medications   Medication Sig Start Date  End Date Taking? Authorizing Provider  diclofenac sodium (VOLTAREN) 1 % GEL Apply 2 g topically 4 (four) times daily. Patient not taking: Reported on 05/25/2015 06/06/14   Ethelda Chick, MD  meloxicam (MOBIC) 15 MG tablet Take 1 tablet (15 mg total) by mouth daily. Patient not taking: Reported on 02/06/2016 05/25/15   Raelyn Ensign, PA   ROS: The patient denies fevers, chills, night sweats, unintentional weight loss, chest pain, palpitations, wheezing, dyspnea on exertion, nausea, vomiting, abdominal pain, dysuria, hematuria, melena, numbness, weakness, or tingling.   All other systems have been reviewed and were otherwise negative with the exception of those mentioned in the HPI and as above.    PHYSICAL EXAM: Filed Vitals:   02/06/16 1240 02/06/16 1329  BP: 156/88 156/80  Pulse: 99   Temp: 98.1 F (36.7 C)   Resp: 16    Body mass index is 35.78 kg/(m^2).  General: Alert, no acute distress HEENT:  Normocephalic, atraumatic, oropharynx patent.  Eye: Nonie Hoyer [facility] Cardiovascular: Regular rate and rhythm, no rubs murmurs or gallops. No Carotid bruits, radial pulse intact. No pedal edema.  Respiratory: Clear to auscultation bilaterally. No wheezes, rales, or rhonchi. No cyanosis, no use of accessory musculature Abdominal: No organomegaly, abdomen is soft and non-tender, positive bowel  sounds. No masses. Musculoskeletal: Gait intact. No edema, tenderness Skin: No rashes. Neurologic: Facial musculature symmetric. Psychiatric: Patient acts appropriately throughout our interaction. Lymphatic: .5 cm lymph node R posterior cervical. No submandibular lymphadenopathy.   LABS  Results for orders placed or performed in visit on 02/06/16  POCT glucose (manual entry)  Result Value Ref Range   POC Glucose 126 (A) 70 - 99 mg/dl      EKG/XRAY:   Primary read interpreted [facility]tsaub [facility]  ASSESSMENT/PLAN: Patient has hypertension which needs to be treated. She had a small right posterior  cervical node which needs follow-up. In the past she has been resistant to trying blood pressure medication. I will encourage her to do this.I personally performed the services described in this documentation, which was scribed in my presence. The recorded information has been reviewed and is accurate.    Gross sideeffects, risk and benefits, and alternatives of medications d/w patient. Patient is aware that all medications have potential sideeffects and we are unable to predict every sideeffect or drug-drug interaction that may occur.  Lesle ChrisSteven Annarose Ouellet MD 02/06/2016 1:48 PM

## 2016-02-06 NOTE — Patient Instructions (Addendum)
Please follow-up with your primary care doctor regarding your elevated blood pressure and the lymph node in the right side of your neck.DASH Eating Plan DASH stands for "Dietary Approaches to Stop Hypertension." The DASH eating plan is a healthy eating plan that has been shown to reduce high blood pressure (hypertension). Additional health benefits may include reducing the risk of type 2 diabetes mellitus, heart disease, and stroke. The DASH eating plan may also help with weight loss. WHAT DO I NEED TO KNOW ABOUT THE DASH EATING PLAN? For the DASH eating plan, you will follow these general guidelines:  Choose foods with a percent daily value for sodium of less than 5% (as listed on the food label).  Use salt-free seasonings or herbs instead of table salt or sea salt.  Check with your health care provider or pharmacist before using salt substitutes.  Eat lower-sodium products, often labeled as "lower sodium" or "no salt added."  Eat fresh foods.  Eat more vegetables, fruits, and low-fat dairy products.  Choose whole grains. Look for the word "whole" as the first word in the ingredient list.  Choose fish and skinless chicken or [place] more often than red meat. Limit fish, poultry, and meat to 6 oz (170 g) each day.  Limit sweets, desserts, sugars, and sugary drinks.  Choose heart-healthy fats.  Limit cheese to 1 oz (28 g) per day.  Eat more home-cooked food and less restaurant, buffet, and fast food.  Limit fried foods.  Cook foods using methods other than frying.  Limit canned vegetables. If you do use them, rinse them well to decrease the sodium.  When eating at a restaurant, ask that your food be prepared with less salt, or no salt if possible. WHAT FOODS CAN I EAT? Seek help from a dietitian for individual calorie needs. Grains Whole grain or whole wheat bread. Brown rice. Whole grain or whole wheat pasta. Quinoa, bulgur, and whole grain cereals. Low-sodium cereals. Corn or  whole wheat flour tortillas. Whole grain cornbread. Whole grain crackers. Low-sodium crackers. Vegetables Fresh or frozen vegetables (raw, steamed, roasted, or grilled). Low-sodium or reduced-sodium tomato and vegetable juices. Low-sodium or reduced-sodium tomato sauce and paste. Low-sodium or reduced-sodium canned vegetables.  Fruits All fresh, canned (in natural juice), or frozen fruits. Meat and Other Protein Products Ground beef (85% or leaner), grass-fed beef, or beef trimmed of fat. Skinless chicken or [place]. Ground chicken or [place]. Pork trimmed of fat. All fish and seafood. Eggs. Dried beans, peas, or lentils. Unsalted nuts and seeds. Unsalted canned beans. Dairy Low-fat dairy products, such as skim or 1% milk, 2% or reduced-fat cheeses, low-fat ricotta or cottage cheese, or plain low-fat yogurt. Low-sodium or reduced-sodium cheeses. Fats and Oils Tub margarines without trans fats. Light or reduced-fat mayonnaise and salad dressings (reduced sodium). Avocado. Safflower, olive, or canola oils. Natural peanut or almond butter. Other Unsalted popcorn and pretzels. The items listed above may not be a complete list of recommended foods or beverages. Contact your dietitian for more options. WHAT FOODS ARE NOT RECOMMENDED? Grains White bread. White pasta. White rice. Refined cornbread. Bagels and croissants. Crackers that contain trans fat. Vegetables Creamed or fried vegetables. Vegetables in a cheese sauce. Regular canned vegetables. Regular canned tomato sauce and paste. Regular tomato and vegetable juices. Fruits Dried fruits. Canned fruit in light or heavy syrup. Fruit juice. Meat and Other Protein Products Fatty cuts of meat. Ribs, chicken wings, bacon, sausage, bologna, salami, chitterlings, fatback, hot dogs, bratwurst, and packaged luncheon meats. Salted  nuts and seeds. Canned beans with salt. Dairy Whole or 2% milk, cream, half-and-half, and cream cheese. Whole-fat or sweetened  yogurt. Full-fat cheeses or blue cheese. Nondairy creamers and whipped toppings. Processed cheese, cheese spreads, or cheese curds. Condiments Onion and garlic salt, seasoned salt, table salt, and sea salt. Canned and packaged gravies. Worcestershire sauce. Tartar sauce. Barbecue sauce. Teriyaki sauce. Soy sauce, including reduced sodium. Steak sauce. Fish sauce. Oyster sauce. Cocktail sauce. Horseradish. Ketchup and mustard. Meat flavorings and tenderizers. Bouillon cubes. Hot sauce. Tabasco sauce. Marinades. Taco seasonings. Relishes. Fats and Oils Butter, stick margarine, lard, shortening, ghee, and bacon fat. Coconut, palm kernel, or palm oils. Regular salad dressings. Other Pickles and olives. Salted popcorn and pretzels. The items listed above may not be a complete list of foods and beverages to avoid. Contact your dietitian for more information. WHERE CAN I FIND MORE INFORMATION? National Heart, Lung, and Blood Institute: CableProm[employer].itwww.nhlbi.nih.gov/health/health-topics/topics/dash/   This information is not intended to replace advice given to you by your health care provider. Make sure you discuss any questions you have with your health care provider.   Document Released: 08/12/2011 Document Revised: 09/13/2014 Document Reviewed: 06/27/2013 Elsevier Interactive Patient Education 2016 [employer]     IF you received an x-ray today, you will receive an invoice from [facility]. Please contact [facility] at [phone] with questions or concerns regarding your invoice.   IF you received labwork today, you will receive an invoice from [facility]. Please contact [facility] at [phone] with questions or concerns regarding your invoice.   Our billing staff will not be able to assist you with questions regarding bills from these companies.  You will be contacted with the lab results as soon as they are available. The fastest way to get your results  is to activate your My Chart account. Instructions are located on the last page of this paperwork. If you have not heard from us regarding the results in 2 weeks, please contact this office.

## 2016-02-09 ENCOUNTER — Ambulatory Visit (INDEPENDENT_AMBULATORY_CARE_PROVIDER_SITE_OTHER): Payer: Self-pay | Admitting: Physician Assistant

## 2016-02-09 ENCOUNTER — Encounter: Payer: Self-pay | Admitting: *Deleted

## 2016-02-09 DIAGNOSIS — Z111 Encounter for screening for respiratory tuberculosis: Secondary | ICD-10-CM

## 2016-02-09 LAB — TB SKIN TEST
INDURATION: 0 mm
TB Skin Test: NEGATIVE

## 2016-02-09 NOTE — Progress Notes (Signed)
Here for a TB read

## 2016-02-17 ENCOUNTER — Other Ambulatory Visit: Payer: Self-pay | Admitting: Family Medicine

## 2016-02-17 DIAGNOSIS — Z1231 Encounter for screening mammogram for malignant neoplasm of breast: Secondary | ICD-10-CM

## 2016-03-04 ENCOUNTER — Ambulatory Visit: Payer: Managed Care, Other (non HMO)

## 2016-05-31 ENCOUNTER — Telehealth: Payer: Self-pay

## 2016-05-31 NOTE — Telephone Encounter (Signed)
DAUB - Form has incomplete section.  Please check off the appropriate box.  I have left the form in the nurses box.   Call her at [phone] when ready to pick up

## 2016-05-31 NOTE — Telephone Encounter (Signed)
Routed to [employer]

## 2017-02-23 ENCOUNTER — Ambulatory Visit (INDEPENDENT_AMBULATORY_CARE_PROVIDER_SITE_OTHER): Payer: Managed Care, Other (non HMO) | Admitting: Physician Assistant

## 2017-05-30 ENCOUNTER — Encounter (HOSPITAL_COMMUNITY): Payer: Self-pay

## 2017-05-30 ENCOUNTER — Emergency Department (HOSPITAL_COMMUNITY): Payer: Self-pay

## 2017-05-30 ENCOUNTER — Emergency Department (HOSPITAL_COMMUNITY)
Admission: EM | Admit: 2017-05-30 | Discharge: 2017-05-30 | Disposition: A | Discharge status: Home / Self Care | Payer: Self-pay | Attending: Emergency Medicine | Admitting: Emergency Medicine

## 2017-05-30 DIAGNOSIS — H538 Other visual disturbances: Secondary | ICD-10-CM | POA: Insufficient documentation

## 2017-05-30 DIAGNOSIS — R030 Elevated blood-pressure reading, without diagnosis of hypertension: Secondary | ICD-10-CM | POA: Insufficient documentation

## 2017-05-30 DIAGNOSIS — R739 Hyperglycemia, unspecified: Secondary | ICD-10-CM | POA: Insufficient documentation

## 2017-05-30 DIAGNOSIS — I10 Essential (primary) hypertension: Secondary | ICD-10-CM | POA: Insufficient documentation

## 2017-05-30 DIAGNOSIS — J0140 Acute pansinusitis, unspecified: Secondary | ICD-10-CM | POA: Insufficient documentation

## 2017-05-30 DIAGNOSIS — H938X2 Other specified disorders of left ear: Secondary | ICD-10-CM | POA: Insufficient documentation

## 2017-05-30 LAB — DIFFERENTIAL
Basophils Absolute: 0 10*3/uL (ref 0.0–0.1)
Basophils Relative: 0 %
Eosinophils Absolute: 0 10*3/uL (ref 0.0–0.7)
Eosinophils Relative: 0 %
Lymphocytes Relative: 12 %
Lymphs Abs: 1.9 10*3/uL (ref 0.7–4.0)
Monocytes Absolute: 0.6 10*3/uL (ref 0.1–1.0)
Monocytes Relative: 4 %
Neutro Abs: 13.2 10*3/uL — ABNORMAL HIGH (ref 1.7–7.7)
Neutrophils Relative %: 84 %

## 2017-05-30 LAB — COMPREHENSIVE METABOLIC PANEL
ALT: 29 U/L (ref 14–54)
AST: 23 U/L (ref 15–41)
Albumin: 3.8 g/dL (ref 3.5–5.0)
Alkaline Phosphatase: 110 U/L (ref 38–126)
Anion gap: 11 (ref 5–15)
BUN: 8 mg/dL (ref 6–20)
CO2: 24 mmol/L (ref 22–32)
Calcium: 9.5 mg/dL (ref 8.9–10.3)
Chloride: 98 mmol/L — ABNORMAL LOW (ref 101–111)
Creatinine, Ser: 0.86 mg/dL (ref 0.44–1.00)
GFR calc Af Amer: >60 mL/min (ref >60)
GFR calc non Af Amer: >60 mL/min (ref >60)
Glucose, Bld: 391 mg/dL — ABNORMAL HIGH (ref 65–99)
Potassium: 4.9 mmol/L (ref 3.5–5.1)
Sodium: 133 mmol/L — ABNORMAL LOW (ref 135–145)
Total Bilirubin: 0.5 mg/dL (ref 0.3–1.2)
Total Protein: 8 g/dL (ref 6.5–8.1)

## 2017-05-30 LAB — CBC
HCT: 40.6 % (ref 36.0–46.0)
Hemoglobin: 13 g/dL (ref 12.0–15.0)
MCH: 25.6 pg — ABNORMAL LOW (ref 26.0–34.0)
MCHC: 32 g/dL (ref 30.0–36.0)
MCV: 79.9 fL (ref 78.0–100.0)
Platelets: 454 10*3/uL — ABNORMAL HIGH (ref 150–400)
RBC: 5.08 MIL/uL (ref 3.87–5.11)
RDW: 13.6 % (ref 11.5–15.5)
WBC: 15.7 10*3/uL — ABNORMAL HIGH (ref 4.0–10.5)

## 2017-05-30 MED ORDER — AMOXICILLIN-POT CLAVULANATE 875-125 MG PO TABS: 1.0000 | ORAL_TABLET | Freq: Two times a day (BID) | ORAL | 0 refills | Status: AC

## 2017-05-30 MED ORDER — METFORMIN HCL 500 MG PO TABS: 500.0000 mg | ORAL_TABLET | Freq: Every day | ORAL | 0 refills | Status: DC

## 2017-05-30 MED ORDER — AMOXICILLIN-POT CLAVULANATE 875-125 MG PO TABS
1.0000 | ORAL_TABLET | Freq: Two times a day (BID) | ORAL | Status: DC
  Administered 2017-05-30: 1 via ORAL
  Filled 2017-05-30: qty 1

## 2017-05-30 MED ORDER — SODIUM CHLORIDE 0.9 % IV BOLUS (SEPSIS)
1000.0000 mL | Freq: Once | INTRAVENOUS | Status: AC
  Administered 2017-05-30: 1000 mL via INTRAVENOUS

## 2017-05-30 MED ORDER — CETIRIZINE HCL 10 MG PO TABS: 10.0000 mg | ORAL_TABLET | Freq: Every day | ORAL | 0 refills | Status: DC

## 2017-05-30 MED ORDER — FLUTICASONE PROPIONATE 50 MCG/ACT NA SUSP: 1.0000 | Freq: Every day | NASAL | 0 refills | Status: DC

## 2017-05-30 MED ORDER — ONDANSETRON 4 MG PO TBDP
4.0000 mg | ORAL_TABLET | Freq: Once | ORAL | Status: AC
  Administered 2017-05-30: 4 mg via ORAL
  Filled 2017-05-30: qty 1

## 2017-05-30 NOTE — ED Notes (Signed)
Patient has urine sample at triage 

## 2017-05-30 NOTE — ED Notes (Addendum)
Pt just passed stoke swallow screen, was given a glass of water and pt quickly drank the cup of water.  Pt then said she drank the water to fast and began to vomit all over the floor.

## 2017-05-30 NOTE — ED Notes (Signed)
Pt taken to MRI  

## 2017-05-30 NOTE — ED Triage Notes (Signed)
Pt. Arrived and reported that 3 weeks ago she developed ear fullness, nasal congestion and itchy eyes.  She reports that the nasal congestion and itchy eyes have resolved, however her lt. Ear has decrfeased hearing and feels full When speaking with pt. She is having mild rt. Facial droop. Pt. Denies any pain or discomfort.  VAN screen is negative.  Neuro screen is negative except for mild rt. Facial droop.  Dr. Anitra Lauth  Came up to triage and assessed the pt..  Orders placed per Dr. Anitra Lauth.  Pt. Is alert and oriented X4. Skin is warm and dry.  Speech is clear.

## 2017-05-30 NOTE — Discharge Instructions (Signed)
Please take all of your antibiotics until finished!   You may develop abdominal discomfort or diarrhea from the antibiotic.  You may help offset this with probiotics which you can buy or get in yogurt. Do not eat  or take the probiotics until 2 hours after your antibiotic. Use Zyrtec as needed for nasal congestion.  Start taking metformin to help regulate your high blood sugar. This medication may make you have some side effects including diarrhea, so I recommend taking half a tablet for a few days to start before taking a full tablet.  Follow-up with a primary care physician for reevaluation of your blood pressure, blood sugar, and ear fullness as soon as possible. Return to the ED immediately if any concerning signs or symptoms develop such as fevers, passing out, slurred speech, or weakness.

## 2017-05-30 NOTE — ED Provider Notes (Signed)
MSE: Patient presenting with greater than 12 hours of positional dizziness which is worse with walking that she describes as intermittent spinning. On exam patient noted to have a mild right-sided facial droop. It appears to involve the for head. She denies any speech, swallowing issues. No unilateral weakness or numbness. No ataxia noted on exam. Patient does not need criteria for LVO and VAN neg.  also the symptoms started over 12 hours ago which puts her outside of any stroke window. this could be early presentation of Bell's palsy however with the vertiginous symptoms associated will do blood sugar testing, basic labs and a CT. Patient was also noted to be hypertensive today. No prior history of hypertension.   Gwyneth Sprout, MD 05/31/17 [phone]

## 2017-05-30 NOTE — ED Provider Notes (Signed)
[facility]-EMERGENCY DEPT Provider Note   CSN: [id number] Arrival date & time: 05/30/17  1240     History   Chief Complaint Chief Complaint  Patient presents with  . Dizziness  . Blurred Vision  . Ear Fullness    HPI Lisa Cross is a 46 y.o. female with history of anemia, HTN, and hyperglycemia who presents today with chief complaint acute onset, progressively worsening left ear fullness and dizziness. She states that 3 weeks ago she developed left ear fullness as well as sinus pressure. Sinus pressure resolved, but ear fullness has persisted. She denies tinnitus, drop attacks,or hearing loss. Also endorses intermittent blurry vision. She does endorse a slight difficulty with ambulation and states that today she had an episode of dizziness which lasted for approximately 45 minutes in which she felt "the room was spinning". She has also had 5 episodes of nonbloody nonbilious emesis today. No aggravating or alleviating factors noted. She denies fevers, chills, chest pain, shortness of breath, abdominal pain, urinary symptoms, diarrhea, or constipation. She denies slurred speech. She states that she was told while in triage that she had a mild right-sided facial droop that she did not think was present before. She does note that her blood pressure is high today, and she does not take any medications for her blood pressure. 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   The history is provided by the patient.    Past Medical History:  Diagnosis Date  . Anemia     Patient Active Problem List   Diagnosis Date Noted  . Essential hypertension 02/06/2016  . Hyperglycemia 02/06/2016    Past Surgical History:  Procedure Laterality Date  . ABDOMINAL HYSTERECTOMY    . BRAIN SURGERY    . CHOLECYSTECTOMY      OB History    No data available       Home Medications    Prior to Admission medications   Medication Sig Start Date End Date Taking? Authorizing Provider    amoxicillin-clavulanate (AUGMENTIN) 875-125 MG tablet Take 1 tablet by mouth 2 (two) times daily. 05/30/17 06/09/17  Michela Pitcher A, PA-C  cetirizine (ZYRTEC ALLERGY) 10 MG tablet Take 1 tablet (10 mg total) by mouth daily. 05/30/17   Luevenia Maxin, Ankith Edmonston A, PA-C  metFORMIN (GLUCOPHAGE) 500 MG tablet Take 1 tablet (500 mg total) by mouth daily with breakfast. 05/30/17   Jeanie Sewer, PA-C    Family History Family History  Problem Relation Age of Onset  . Hypertension Mother   . Hypertension Father   . Dementia Maternal Grandfather   . Hypertension Paternal Grandmother   . Hypertension Paternal Grandfather     Social History Social History  Substance Use Topics  . Smoking status: Never Smoker  . Smokeless tobacco: Never Used  . Alcohol use No     Allergies   Patient has no known allergies.   Review of Systems Review of Systems  Eyes: Positive for visual disturbance.  Neurological: Positive for dizziness and facial asymmetry. Negative for syncope, speech difficulty, weakness and numbness.     Physical Exam Updated Vital Signs BP (!) 160/84 (BP Location: Right Arm)   Pulse (!) 118   Temp 97.9 F (36.6 C) (Oral)   Resp 15   Ht  (1.626 m)   Wt 97.5 kg (215 lb)   SpO2 100%   BMI 36.90 kg/m   Physical Exam  Constitutional: She is oriented to person, place, and time. She appears well-developed  and well-nourished. No distress.  HENT:  Head: Normocephalic and atraumatic.  Right Ear: External ear normal.  Left Ear: External ear normal.  Left ear with middle ear effusion, no erythema or bulging. No frontal or maxillary sinus TTP. Posterior oropharynx unremarkable. Nasal septum is midline with pink mucosa and no nasal congestion.  Eyes: Pupils are equal, round, and reactive to light. Conjunctivae and EOM are normal. Right eye exhibits no discharge. Left eye exhibits no discharge.  No pain with EOMs, peripheral field vision intact    Visual Acuity  Right Eye  Distance: 20/40 Left Eye Distance: 20/32 Bilateral Distance: 20/20    Neck: Normal range of motion. Neck supple. No JVD present. No tracheal deviation present.  Cardiovascular: Regular rhythm and normal heart sounds.   Tachycardic, no lower extremity edema, 2+ radial and DP/PT pulses bl, negative Homan's bl   Pulmonary/Chest: Effort normal and breath sounds normal. No respiratory distress. She has no wheezes. She has no rales. She exhibits no tenderness.  Abdominal: Soft. Bowel sounds are normal. She exhibits no distension. There is no tenderness.  Musculoskeletal: Normal range of motion. She exhibits no edema or tenderness.  No midline spine TTP, no paraspinal muscle tenderness, no deformity, crepitus, or step-off noted. 5/5 strength of BUE and BLE major muscle groups.   Lymphadenopathy:    She has no cervical adenopathy.  Neurological: She is alert and oriented to person, place, and time. No sensory deficit. She exhibits normal muscle tone.  Fluent speech, mild right-sided facial droop which involves the forehead. Otherwise cranial nerves are intact. Ambulates without difficulty, able to heel walk and toe walk without difficulty.She does state that she feels unsteady when she walks. No pronator drift, no nystagmus, normal heel to shin of the bilateral lower extremities and normal finger to nose of the bilateral upper extremities. Negative Romberg.5/5 strength of BUE and BLE major muscle groups including good strength of EHL and grip strength.  Skin: Skin is warm and dry. No erythema.  Psychiatric: She has a normal mood and affect. Her behavior is normal.  Nursing note and vitals reviewed.    ED Treatments / Results  Labs (all labs ordered are listed, but only abnormal results are displayed) Labs Reviewed  CBC - Abnormal; Notable for the following:       Result Value   WBC 15.7 (*)    MCH 25.6 (*)    Platelets 454 (*)    All other components within normal limits  DIFFERENTIAL -  Abnormal; Notable for the following:    Neutro Abs 13.2 (*)    All other components within normal limits  COMPREHENSIVE METABOLIC PANEL - Abnormal; Notable for the following:    Sodium 133 (*)    Chloride 98 (*)    Glucose, Bld 391 (*)    All other components within normal limits  CBG MONITORING, ED  CBG MONITORING, ED    EKG  EKG Interpretation  Date/Time:  Monday May 30 2017 12:53:17 EDT Ventricular Rate:  100 PR Interval:  162 QRS Duration: 88 QT Interval:  380 QTC Calculation: 490 R Axis:   -6 Text Interpretation:  Normal sinus rhythm Possible Left atrial enlargement Prolonged QT Abnormal ECG Confirmed by Tilden Fossa [phone]) on 05/30/2017 10:28:39 PM       Radiology Ct Head Wo Contrast  Result Date: 05/30/2017 CLINICAL DATA:  Nausea and dizziness. Left ear hearing loss. Cholesteatoma/neoplasm -surgical planning. EXAM: CT HEAD WITHOUT CONTRAST TECHNIQUE: Contiguous axial images were obtained from the base of  the skull through the vertex without intravenous contrast. COMPARISON:  None. FINDINGS: Brain: This study was performed as a routine head CT. There is no evidence of acute intracranial hemorrhage, mass lesion, brain edema or extra-axial fluid collection. The ventricles and subarachnoid spaces are appropriately sized for age. There is no CT evidence of acute cortical infarction. There is encephalomalacia in the left caudate with adjacent left lateral ventricle porencephaly. Vascular: Intracranial vascular calcifications. No hyperdense vessel identified. Skull: Old craniotomy defect is noted at the left vertex. No acute calvarial findings. Sinuses/Orbits: There is mucosal thickening throughout the visualized paranasal sinuses. The maxillary sinuses are nearly completely opacified bilaterally. There is minimal mastoid fluid bilaterally. The middle ears appear unremarkable. No orbital abnormalities are seen. Other: None. IMPRESSION: 1. No acute intracranial findings. 2. 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 3. Diffuse paranasal sinus inflammation. 4. No gross middle ear abnormality identified on nondedicated imaging. Consider dedicated temporal bone examination. Electronically Signed   By: Carey Bullocks M.D.   On: 05/30/2017 13:58   Mr Brain Wo Contrast (neuro Protocol)  Result Date: 05/30/2017 CLINICAL DATA:  Develops ear fullness, nasal congestion and itchy eyes 3 weeks ago. RIGHT facial droop. Suspect stroke. EXAM: MRI HEAD WITHOUT CONTRAST TECHNIQUE: Multiplanar, multiecho pulse sequences of the brain and surrounding structures were obtained without intravenous contrast. COMPARISON:  CT HEAD May 30, 2017 at 1338 hours FINDINGS: BRAIN: No reduced diffusion to suggest acute ischemia or typical infection. Trace susceptibility artifact associated with LEFT basal ganglia focal encephalomalacia with ex vacuo dilatation LEFT lateral ventricle. No hydrocephalus. No midline shift, mass effect or masses. No abnormal parenchymal signal. Linear low signal LEFT frontal lobe suggesting old catheter tract. No abnormal extra-axial fluid collections. VASCULAR: Normal major intracranial vascular flow voids present at skull base. SKULL AND UPPER CERVICAL SPINE: No abnormal sellar expansion. No suspicious calvarial bone marrow signal. Craniocervical junction maintained. Susceptibility artifact associated with craniotomy. SINUSES/ORBITS: Reduced diffusion and low ADC values associate with bilateral maxillary sinuses with pan paranasal sinus mucosal thickening. Bilateral mastoid effusions. The included ocular globes and orbital contents are non-suspicious. OTHER: None. IMPRESSION: 1. No acute intracranial process. 2. 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 Status post craniotomy. 3. Pan paranasal sinusitis with bilateral mastoid effusions. Electronically Signed   By: Awilda Metro M.D.   On: 05/30/2017 21:33     Procedures Procedures (including critical care time)  Medications Ordered in ED Medications  amoxicillin-clavulanate (AUGMENTIN) 875-125 MG per tablet 1 tablet (1 tablet Oral Given 05/30/17 2248)  ondansetron (ZOFRAN-ODT) disintegrating tablet 4 mg (4 mg Oral Given 05/30/17 1859)  sodium chloride 0.9 % bolus 1,000 mL (0 mLs Intravenous Stopped 05/30/17 2045)     Initial Impression / Assessment and Plan / ED Course  I have reviewed the triage vital signs and the nursing notes.  Pertinent labs & imaging results that were available during my care of the patient were reviewed by me and considered in my medical decision making (see chart for details).     Patient presents with 3 weeks of feeling of fullness to the left ear,intermittent vision changes,and new onset dizziness and vomiting. Afebrile, tachycardic and hypertensive while in the ED, intermittently tachypneic but this resolved. She does have a slight facial droop on the right, she is unsure if this is new or chronic. No focal neurological deficits otherwise. Slight leukocytosis of 15.7, and she is hyperglycemic at 391. EKG shows normal sinus rhythm with no evidence of  ST segment abnormality or arrhythmia. CT scan of the head shows no acute intracranial abnormalities. With facial droop and dizziness on ambulation, obtained MRI of the brain which showed no acute intracranial process, left basal ganglia encephalomalacia with probable old left frontal catheter tract. Status post craniotomy. Also shows pan paranasal sinusitis with bilateral mastoid effusions. No evidence of stroke or mass.With middle ear effusion, leukocytosis, and MRI findings, we will treat for bacterial pansinusitis with Augmentin. On reevaluation, patient is resting, tolerating PO, and ambulatory without difficulty. She is tachycardic however she states that she generally becomes anxious and nervous around health care providers. She states she wishes to go home. She stable  for discharge home with Augmentin, Zyrtec for congestion, and we will start her on metformin due to her hyperglycemia. She'll follow-up with primary care physician within one week. Discussed indications for return to the ED. Pt verbalized understanding of and agreement with plan and is safe for discharge home at this time. Patient seen and evaluated by Dr. Madilyn Hook who agrees with assessment and plan at this time.  Final Clinical Impressions(s) / ED Diagnoses   Final diagnoses:  Elevated blood pressure reading  Hyperglycemia  Acute pansinusitis, recurrence not specified    New Prescriptions Discharge Medication List as of 05/30/2017 10:49 PM    START taking these medications   Details  amoxicillin-clavulanate (AUGMENTIN) 875-125 MG tablet Take 1 tablet by mouth 2 (two) times daily., Starting Mon 05/30/2017, Until Thu 06/09/2017, Print    cetirizine (ZYRTEC ALLERGY) 10 MG tablet Take 1 tablet (10 mg total) by mouth daily., Starting Mon 05/30/2017, Print    metFORMIN (GLUCOPHAGE) 500 MG tablet Take 1 tablet (500 mg total) by mouth daily with breakfast., Starting Mon 05/30/2017, Print         [place], [place] A, PA-C 05/31/17 4034    Tilden Fossa, MD 06/01/17 1239

## 2017-05-31 LAB — CBG MONITORING, ED: Glucose-Capillary: 326 mg/dL — ABNORMAL HIGH (ref 65–99)

## 2018-10-30 DIAGNOSIS — Z91148 Patient's other noncompliance with medication regimen for other reason: Secondary | ICD-10-CM | POA: Insufficient documentation

## 2018-10-30 HISTORY — DX: Patient's other noncompliance with medication regimen for other reason: Z91.148

## 2018-12-01 DIAGNOSIS — E119 Type 2 diabetes mellitus without complications: Secondary | ICD-10-CM | POA: Insufficient documentation

## 2019-04-28 DIAGNOSIS — Z91199 Patient's noncompliance with other medical treatment and regimen due to unspecified reason: Secondary | ICD-10-CM | POA: Insufficient documentation

## 2021-01-31 ENCOUNTER — Other Ambulatory Visit: Payer: Self-pay

## 2021-01-31 ENCOUNTER — Encounter (HOSPITAL_COMMUNITY): Payer: Self-pay

## 2021-01-31 ENCOUNTER — Ambulatory Visit (HOSPITAL_COMMUNITY)
Admission: EM | Admit: 2021-01-31 | Discharge: 2021-01-31 | Disposition: A | Discharge status: Home / Self Care | Payer: Managed Care, Other (non HMO) | Attending: Medical Oncology | Admitting: Medical Oncology

## 2021-01-31 DIAGNOSIS — J029 Acute pharyngitis, unspecified: Secondary | ICD-10-CM | POA: Diagnosis not present

## 2021-01-31 DIAGNOSIS — R059 Cough, unspecified: Secondary | ICD-10-CM | POA: Diagnosis not present

## 2021-01-31 DIAGNOSIS — R0982 Postnasal drip: Secondary | ICD-10-CM

## 2021-01-31 NOTE — ED Notes (Addendum)
Pt advised to go to ED by Clent Jacks, PA due to HTN.  Pt refused to go to the emergency room states " I'll pass on that". Nurse educated patient that if she developed an CP, sob, headache, or dizziness to call 911 or go straight to the emergency room.

## 2021-01-31 NOTE — ED Provider Notes (Signed)
[facility]    CSN: [id number] Arrival date & time: 01/31/21  1000      History   Chief Complaint Chief Complaint  Patient presents with  . Sore Throat    HPI Lisa Cross is a 50 y.o. female.   HPI   Sore Throat: Pt reports with a 3 day history of sore throat. She reports a bit of a cough and post nasal drainage but states that it is mild. She has tried warm salt water gargles without much improvement.  She denies any headache, vomiting, abdominal pain, chest pain, shortness of breath.  In terms of her elevated blood pressure today she reports that she is asymptomatic and states that her blood pressure typically is around 140 systolic.  She thinks that her juice drink that she had this morning may have increased her blood pressure.  Past Medical History:  Diagnosis Date  . Anemia     Patient Active Problem List   Diagnosis Date Noted  . Essential hypertension 02/06/2016  . Hyperglycemia 02/06/2016    Past Surgical History:  Procedure Laterality Date  . ABDOMINAL HYSTERECTOMY    . BRAIN SURGERY    . CHOLECYSTECTOMY      OB History   No obstetric history on file.      Home Medications    Prior to Admission medications   Medication Sig Start Date End Date Taking? Authorizing Provider  cetirizine (ZYRTEC ALLERGY) 10 MG tablet Take 1 tablet (10 mg total) by mouth daily. 05/30/17   Luevenia Maxin, Mina A, PA-C  metFORMIN (GLUCOPHAGE) 500 MG tablet Take 1 tablet (500 mg total) by mouth daily with breakfast. 05/30/17   Jeanie Sewer, PA-C    Family History Family History  Problem Relation Age of Onset  . Hypertension Mother   . Hypertension Father   . Dementia Maternal Grandfather   . Hypertension Paternal Grandmother   . Hypertension Paternal Grandfather     Social History Social History   Tobacco Use  . Smoking status: Never Smoker  . Smokeless tobacco: Never Used  Substance Use Topics  . Alcohol use: No    Alcohol/week: 0.0 standard drinks  . Drug  use: No     Allergies   Patient has no known allergies.   Review of Systems Review of Systems  As stated above in HPI Physical Exam Triage Vital Signs ED Triage Vitals  Enc Vitals Group     BP 01/31/21 1023 (!) 190/82     Pulse Rate 01/31/21 1023 66     Resp 01/31/21 1023 18     Temp 01/31/21 1023 99.1 F (37.3 C)     Temp src --      SpO2 01/31/21 1023 95 %     Weight --      Height --      Head Circumference --      Peak Flow --      Pain Score 01/31/21 1017 4     Pain Loc --      Pain Edu? --      Excl. in GC? --    No data found.  Updated Vital Signs BP (!) 190/82   Pulse 66   Temp 99.1 F (37.3 C)   Resp 18   SpO2 95%   Physical Exam Vitals and nursing note reviewed.  Constitutional:      General: She is not in acute distress.    Appearance: She is well-developed. She is not ill-appearing, toxic-appearing or diaphoretic.  HENT:     Head: Normocephalic and atraumatic.     Right Ear: Tympanic membrane and ear canal normal. No tenderness. No middle ear effusion. Tympanic membrane is not erythematous.     Left Ear: Tympanic membrane and ear canal normal. No tenderness.  No middle ear effusion. Tympanic membrane is not erythematous.     Nose: Rhinorrhea present. No congestion.     Mouth/Throat:     Mouth: Mucous membranes are moist. No oral lesions.     Pharynx: Uvula midline. Posterior oropharyngeal erythema (scant) present. No pharyngeal swelling, oropharyngeal exudate or uvula swelling.     Tonsils: No tonsillar exudate or tonsillar abscesses.  Eyes:     Conjunctiva/sclera: Conjunctivae normal.     Pupils: Pupils are equal, round, and reactive to light.  Cardiovascular:     Rate and Rhythm: Normal rate and regular rhythm.     Heart sounds: Normal heart sounds.  Pulmonary:     Effort: Pulmonary effort is normal.     Breath sounds: Normal breath sounds.  Abdominal:     Palpations: Abdomen is soft.  Musculoskeletal:     Cervical back: Normal range  of motion and neck supple.  Lymphadenopathy:     Cervical: Cervical adenopathy present.  Skin:    General: Skin is warm.  Neurological:     Mental Status: She is alert and oriented to person, place, and time.      UC Treatments / Results  Labs (all labs ordered are listed, but only abnormal results are displayed) Labs Reviewed - No data to display  EKG   Radiology No results found.  Procedures Procedures (including critical care time)  Medications Ordered in UC Medications - No data to display  Initial Impression / Assessment and Plan / UC Course  I have reviewed the triage vital signs and the nursing notes.  Pertinent labs & imaging results that were available during my care of the patient were reviewed by me and considered in my medical decision making (see chart for details).     New.  Throat swab and COVID testing.  Discussed hydration with water, decrease salt intake, rest healthy balanced diet to help with symptoms.  Him to call in Flonase for postnasal drainage and Tessalon for her cough.  She can continue using over-the-counter medication such as Tylenol for her sore throat. Recheck BP is higher. Red flag signs and symptoms reviewed and recommendation for her to go to the ER discussed. She declined despite advisement. She also declines COVID-19 testing.  Final Clinical Impressions(s) / UC Diagnoses   Final diagnoses:  None   Discharge Instructions   None    ED Prescriptions    None     PDMP not reviewed this encounter.   Rushie Chestnut, [place] 01/31/21 1111

## 2021-01-31 NOTE — ED Triage Notes (Signed)
Pt in with c/o ST x 3 days. Pt states she feels drainage in her throat  Pt has been doing warm salt water gargles

## 2021-02-02 LAB — CULTURE, GROUP A STREP (THRC)

## 2021-02-24 ENCOUNTER — Other Ambulatory Visit: Payer: Self-pay | Admitting: Family Medicine

## 2021-02-24 DIAGNOSIS — Z1231 Encounter for screening mammogram for malignant neoplasm of breast: Secondary | ICD-10-CM

## 2021-04-07 ENCOUNTER — Ambulatory Visit (HOSPITAL_COMMUNITY)
Admission: EM | Admit: 2021-04-07 | Discharge: 2021-04-07 | Disposition: A | Discharge status: Home / Self Care | Payer: Managed Care, Other (non HMO) | Attending: Family Medicine | Admitting: Family Medicine

## 2021-04-07 ENCOUNTER — Other Ambulatory Visit: Payer: Self-pay

## 2021-04-07 ENCOUNTER — Encounter (HOSPITAL_COMMUNITY): Payer: Self-pay

## 2021-04-07 DIAGNOSIS — L03811 Cellulitis of head [any part, except face]: Secondary | ICD-10-CM

## 2021-04-07 DIAGNOSIS — R590 Localized enlarged lymph nodes: Secondary | ICD-10-CM

## 2021-04-07 DIAGNOSIS — I10 Essential (primary) hypertension: Secondary | ICD-10-CM | POA: Diagnosis not present

## 2021-04-07 MED ORDER — CEPHALEXIN 500 MG PO CAPS: 500.0000 mg | ORAL_CAPSULE | Freq: Four times a day (QID) | ORAL | 0 refills | Status: AC

## 2021-04-07 NOTE — ED Triage Notes (Signed)
Pt reports having a bump in the right side of the neck x 1 day. States is sore to touch.

## 2021-04-07 NOTE — Discharge Instructions (Addendum)
We will treat you for a possible skin infection of your scalp.  You should take Keflex 4 times a day for the next 7 days.  If this does not improve your symptoms, you should be seen by your regular doctor for follow-up.  If you start having fevers, difficulty breathing, you are unable to tolerate anything by mouth, you start drooling and have trouble swallowing, you should be seen in the emergency room right away.  If you find it difficult to move your neck, you have fevers, you should also be seen in the emergency room right away.  In regards to your blood pressure, you should follow-up with your regular doctor.  If you experience changes in your vision, severe headache, weakness on one side of your body or the other, numbness and tingling on one side of your body or the other, difficulty urinating or decreased urination, flank pain, chest pain, difficulty breathing, you should go to the emergency room right away.

## 2021-04-07 NOTE — ED Provider Notes (Signed)
[facility]    CSN: [id number] Arrival date & time: 04/07/21  1349      History   Chief Complaint Chief Complaint  Patient presents with   Neck Pain    HPI Lisa Cross is a 50 y.o. female.   Right sided swollen lymph node Yesterday noticed that she had a swollen area on the right side of her neck She reports that it is slightly tender, but otherwise not painful She endorses full range of motion of her neck She denies difficulty swallowing or speaking Denies shortness of breath Denies fevers States that she is otherwise feeling well Denies fevers, states that she is otherwise feeling well Reports that she has had some lesions on her head and her scalp that she has been picking at   Past Medical History:  Diagnosis Date   Anemia     Patient Active Problem List   Diagnosis Date Noted   Essential hypertension 02/06/2016   Hyperglycemia 02/06/2016    Past Surgical History:  Procedure Laterality Date   ABDOMINAL HYSTERECTOMY     BRAIN SURGERY     CHOLECYSTECTOMY      OB History   No obstetric history on file.      Home Medications    Prior to Admission medications   Medication Sig Start Date End Date Taking? Authorizing Provider  cephALEXin (KEFLEX) 500 MG capsule Take 1 capsule (500 mg total) by mouth 4 (four) times daily for 7 days. 04/07/21 04/14/21 Yes Brallan Denio, Solmon Ice, DO  cetirizine (ZYRTEC ALLERGY) 10 MG tablet Take 1 tablet (10 mg total) by mouth daily. 05/30/17   Luevenia Maxin, Mina A, PA-C  metFORMIN (GLUCOPHAGE) 500 MG tablet Take 1 tablet (500 mg total) by mouth daily with breakfast. 05/30/17   Jeanie Sewer, PA-C    Family History Family History  Problem Relation Age of Onset   Hypertension Mother    Hypertension Father    Dementia Maternal Grandfather    Hypertension Paternal Grandmother    Hypertension Paternal Grandfather     Social History Social History   Tobacco Use   Smoking status: Never   Smokeless tobacco: Never   Substance Use Topics   Alcohol use: No    Alcohol/week: 0.0 standard drinks   Drug use: No     Allergies   Patient has no known allergies.   Review of Systems Review of Systems  Constitutional:  Negative for activity change, appetite change, chills, fatigue and fever.  HENT:  Negative for congestion, rhinorrhea, sore throat, trouble swallowing and voice change.   Eyes:  Negative for visual disturbance.  Respiratory:  Negative for cough and shortness of breath.   Cardiovascular:  Negative for chest pain, palpitations and leg swelling.  Gastrointestinal:  Negative for diarrhea, nausea and vomiting.  Genitourinary:  Negative for difficulty urinating and flank pain.  Musculoskeletal:  Negative for neck pain and neck stiffness.  Skin:  Negative for rash and wound.       Scabbing on head  Neurological:  Negative for dizziness, seizures, syncope, weakness, light-headedness and headaches.    Physical Exam Triage Vital Signs ED Triage Vitals  Enc Vitals Group     BP 04/07/21 1436 (!) 168/96     Pulse --      Resp 04/07/21 1436 18     Temp 04/07/21 1436 98.8 F (37.1 C)     Temp Source 04/07/21 1436 Oral     SpO2 04/07/21 1436 100 %     Weight --  Height --      Head Circumference --      Peak Flow --      Pain Score 04/07/21 1435 4     Pain Loc --      Pain Edu? --      Excl. in GC? --    No data found.  Updated Vital Signs BP (!) 168/96 (BP Location: Right Arm)   Temp 98.8 F (37.1 C) (Oral)   Resp 18   SpO2 100%   Visual Acuity Right Eye Distance:   Left Eye Distance:   Bilateral Distance:    Right Eye Near:   Left Eye Near:    Bilateral Near:     Physical Exam Constitutional:      General: She is not in acute distress.    Appearance: Normal appearance. She is not ill-appearing or toxic-appearing.  HENT:     Head: Normocephalic and atraumatic.     Mouth/Throat:     Mouth: Mucous membranes are moist.     Pharynx: No oropharyngeal exudate.      Comments: Pharynx clear Eyes:     Extraocular Movements: Extraocular movements intact.     Pupils: Pupils are equal, round, and reactive to light.  Cardiovascular:     Rate and Rhythm: Normal rate.  Pulmonary:     Effort: Pulmonary effort is normal.     Breath sounds: Normal breath sounds.  Musculoskeletal:     Cervical back: Normal range of motion and neck supple. No rigidity or tenderness.  Lymphadenopathy:     Cervical: Cervical adenopathy (1cm movable tender posterior cervical lymph node) present.  Skin:    Comments: Right scalp with few pustules and surrounding erythema  Neurological:     General: No focal deficit present.     Mental Status: She is alert and oriented to person, place, and time.     Cranial Nerves: No cranial nerve deficit.     Gait: Gait normal.  Psychiatric:        Mood and Affect: Mood normal.        Behavior: Behavior normal.        Thought Content: Thought content normal.        Judgment: Judgment normal.     UC Treatments / Results  Labs (all labs ordered are listed, but only abnormal results are displayed) Labs Reviewed - No data to display  EKG   Radiology No results found.  Procedures Procedures (including critical care time)  Medications Ordered in UC Medications - No data to display  Initial Impression / Assessment and Plan / UC Course  I have reviewed the triage vital signs and the nursing notes.  Pertinent labs & imaging results that were available during my care of the patient were reviewed by me and considered in my medical decision making (see chart for details).     Patient is a 50 year old female with past medical history significant for hypertension and type 2 diabetes who presents with right-sided posterior cervical lymphadenopathy that started yesterday.  She also reports possible scalp infection that has been occurring over the last few weeks.  She is otherwise feeling normal.  No fevers.  She is able to swallow and  breathe without difficulty.  On exam, right-sided posterior cervical lymph node is movable and slightly tender, likely secondary to infection.  Her scalp also shows a possible superimposed bacterial infection, therefore we will treat her with Keflex for 7 days.  She was advised that if this does not  improve her symptoms and she continues to have a swollen lymph node, she should be seen by her regular doctor within 1 week.  She has no prior history of renal dysfunction, Keflex 500 mg every 6 hours for 7 days was prescribed.  Advised the emergency room immediately if she has difficulty breathing, difficulty swallowing, drooling, significant pain, fevers, neck rigidity.  In regards to her hypertension, she is currently asymptomatic and neurologically intact on exam.  She was advised to follow-up with her regular doctor.  She was advised to go to the emergency room if she develops severe headache, changes in vision, chest pain, shortness of breath, difficulty urinating, flank pain.  She was discharged home in stable condition.  Final Clinical Impressions(s) / UC Diagnoses   Final diagnoses:  Posterior cervical adenopathy  Cellulitis of head except face  Essential hypertension     Discharge Instructions      We will treat you for a possible skin infection of your scalp.  You should take Keflex 4 times a day for the next 7 days.  If this does not improve your symptoms, you should be seen by your regular doctor for follow-up.  If you start having fevers, difficulty breathing, you are unable to tolerate anything by mouth, you start drooling and have trouble swallowing, you should be seen in the emergency room right away.  If you find it difficult to move your neck, you have fevers, you should also be seen in the emergency room right away.  In regards to your blood pressure, you should follow-up with your regular doctor.  If you experience changes in your vision, severe headache, weakness on one side of  your body or the other, numbness and tingling on one side of your body or the other, difficulty urinating or decreased urination, flank pain, chest pain, difficulty breathing, you should go to the emergency room right away.     ED Prescriptions     Medication Sig Dispense Auth. Provider   cephALEXin (KEFLEX) 500 MG capsule Take 1 capsule (500 mg total) by mouth 4 (four) times daily for 7 days. 28 capsule Tajah Noguchi, Solmon Ice, DO      PDMP not reviewed this encounter.   Tanzie Rothschild, Solmon Ice, DO 04/07/21 1641

## 2021-04-09 ENCOUNTER — Emergency Department (HOSPITAL_COMMUNITY)
Admission: EM | Admit: 2021-04-09 | Discharge: 2021-04-09 | Disposition: A | Discharge status: Home / Self Care | Payer: Managed Care, Other (non HMO) | Attending: Emergency Medicine | Admitting: Emergency Medicine

## 2021-04-09 DIAGNOSIS — I10 Essential (primary) hypertension: Secondary | ICD-10-CM | POA: Insufficient documentation

## 2021-04-09 DIAGNOSIS — R220 Localized swelling, mass and lump, head: Secondary | ICD-10-CM | POA: Diagnosis present

## 2021-04-09 DIAGNOSIS — L739 Follicular disorder, unspecified: Secondary | ICD-10-CM | POA: Insufficient documentation

## 2021-04-09 MED ORDER — DIPHENHYDRAMINE HCL 25 MG PO CAPS
25.0000 mg | ORAL_CAPSULE | Freq: Once | ORAL | Status: AC
  Administered 2021-04-09: 25 mg via ORAL
  Filled 2021-04-09: qty 1

## 2021-04-09 NOTE — Discharge Instructions (Addendum)
The right eye swelling is secondary to the inflammation of the scalp.  Continue to take the antibiotic Keflex.  Return for any tongue swelling lip swelling any difficulty breathing.  This does not appear to be a allergic reaction.  Follow-up with urgent care as needed.

## 2021-04-09 NOTE — ED Provider Notes (Signed)
Emergency Medicine Provider Triage Evaluation Note  Lisa Cross , a 50 y.o. female  was evaluated in triage.  Pt complains of presents with facial swelling, started this morning, noted swelling below her right eyelid, denies any visual changes, pain, denies tongue, throat, lip swelling.  Patient was recently started Keflex, started on Tuesday, for a possible scalp infection.  Denies any other symptoms.  No GI symptoms..  Review of Systems  Positive: Facial swelling Negative: Tongue, throat swelling  Physical Exam  BP (!) 180/98   Pulse (!) 109   Temp 98.7 F (37.1 C)   Resp 14   SpO2 99%  Gen:   Awake, no distress   Resp:  Normal effort  MSK:   Moves extremities without difficulty  Other:  Oropharynx is visualized tongue are both midline, controlling oral secretions, no wheezing or rhonchi heard on exam.  Medical Decision Making  Medically screening exam initiated at 11:21 AM.  Appropriate orders placed.  Lisa Cross was informed that the remainder of the evaluation will be completed by another provider, this initial triage assessment does not replace that evaluation, and the importance of remaining in the ED until their evaluation is complete.  Presents with facial swelling, patient will need further work-up.   Carroll Sage, PA-C 04/09/21 1122    Vanetta Mulders, MD 04/24/21 [phone]

## 2021-04-09 NOTE — ED Triage Notes (Signed)
Patient complains of right ey swelling and right neck swelling that started this morning. No oral swelling, no dyspnea, no vision changes.

## 2021-04-09 NOTE — ED Notes (Signed)
Patient refusing vitals to be taken at this time. Pt states "I already know my blood pressure is going to be high because I'm upset that I've had to wait 6 hours to be seen".

## 2021-04-09 NOTE — ED Provider Notes (Signed)
[facility] EMERGENCY DEPARTMENT Provider Note   CSN: [id number] Arrival date & time: 04/09/21  1035     History Chief Complaint  Patient presents with   Facial Swelling    Lisa Cross is a 50 y.o. female.  Patient seen August 2 at the urgent care.  Have folliculitis to the scalp right side more than left.  Started on Keflex.  Patient states that the left-sided scalp is healing well.  Still some lesions on the right side of scalp.  This morning had swelling up under the right eye.  No eye discharge no eye pain no visual change.  No other facial swelling.  Patient has persistent lymph nodes to the right side of the neck.  That were identified by urgent care visit.  No fever.  No tongue swelling no lip swelling no rash or hives.  No trouble breathing      Past Medical History:  Diagnosis Date   Anemia     Patient Active Problem List   Diagnosis Date Noted   Essential hypertension 02/06/2016   Hyperglycemia 02/06/2016    Past Surgical History:  Procedure Laterality Date   ABDOMINAL HYSTERECTOMY     BRAIN SURGERY     CHOLECYSTECTOMY       OB History   No obstetric history on file.     Family History  Problem Relation Age of Onset   Hypertension Mother    Hypertension Father    Dementia Maternal Grandfather    Hypertension Paternal Grandmother    Hypertension Paternal Grandfather     Social History   Tobacco Use   Smoking status: Never   Smokeless tobacco: Never  Substance Use Topics   Alcohol use: No    Alcohol/week: 0.0 standard drinks   Drug use: No    Home Medications Prior to Admission medications   Medication Sig Start Date End Date Taking? Authorizing Provider  cephALEXin (KEFLEX) 500 MG capsule Take 1 capsule (500 mg total) by mouth 4 (four) times daily for 7 days. 04/07/21 04/14/21  Meccariello, Solmon Ice, DO  cetirizine (ZYRTEC ALLERGY) 10 MG tablet Take 1 tablet (10 mg total) by mouth daily. 05/30/17   Luevenia Maxin, Mina A, PA-C   metFORMIN (GLUCOPHAGE) 500 MG tablet Take 1 tablet (500 mg total) by mouth daily with breakfast. 05/30/17   Michela Pitcher A, PA-C    Allergies    Patient has no known allergies.  Review of Systems   Review of Systems  Constitutional:  Negative for chills and fever.  HENT:  Negative for ear pain and sore throat.   Eyes:  Negative for pain and visual disturbance.  Respiratory:  Negative for cough and shortness of breath.   Cardiovascular:  Negative for chest pain and palpitations.  Gastrointestinal:  Negative for abdominal pain and vomiting.  Genitourinary:  Negative for dysuria and hematuria.  Musculoskeletal:  Negative for arthralgias and back pain.  Skin:  Positive for rash. Negative for color change.  Neurological:  Negative for seizures and syncope.  All other systems reviewed and are negative.  Physical Exam Updated Vital Signs BP (!) 157/114   Pulse (!) 110   Temp 98.2 F (36.8 C)   Resp 18   SpO2 100%   Physical Exam Vitals and nursing note reviewed.  Constitutional:      General: She is not in acute distress.    Appearance: Normal appearance. She is well-developed.  HENT:     Head: Normocephalic and atraumatic.  Comments: No facial swelling.  There is skin lesions to the scalp mostly on the right side.    Mouth/Throat:     Mouth: Mucous membranes are moist.     Pharynx: Oropharynx is clear. No posterior oropharyngeal erythema.  Eyes:     Extraocular Movements: Extraocular movements intact.     Conjunctiva/sclera: Conjunctivae normal.     Pupils: Pupils are equal, round, and reactive to light.     Comments: Swelling to the lower eyelid.  No erythema this is on the right side.  Left side is normal.  Extract muscles intact.  No hyphema.  Sclera clear.  No other facial swelling  Neck:     Comments: Some mild palpable lymph nodes to the right side of the neck.  Kind of in the reciprocal area as well. Cardiovascular:     Rate and Rhythm: Normal rate and regular  rhythm.     Heart sounds: No murmur heard. Pulmonary:     Effort: Pulmonary effort is normal. No respiratory distress.     Breath sounds: Normal breath sounds.  Abdominal:     Palpations: Abdomen is soft.     Tenderness: There is no abdominal tenderness.  Musculoskeletal:     Cervical back: Neck supple.  Lymphadenopathy:     Cervical: Cervical adenopathy present.  Skin:    General: Skin is warm and dry.  Neurological:     General: No focal deficit present.     Mental Status: She is alert and oriented to person, place, and time.     Cranial Nerves: No cranial nerve deficit.     Sensory: No sensory deficit.     Motor: No weakness.     Gait: Gait normal.    ED Results / Procedures / Treatments   Labs (all labs ordered are listed, but only abnormal results are displayed) Labs Reviewed - No data to display  EKG None  Radiology No results found.  Procedures Procedures   Medications Ordered in ED Medications  diphenhydrAMINE (BENADRYL) capsule 25 mg (25 mg Oral Given 04/09/21 1132)    ED Course  I have reviewed the triage vital signs and the nursing notes.  Pertinent labs & imaging results that were available during my care of the patient were reviewed by me and considered in my medical decision making (see chart for details).    MDM Rules/Calculators/A&P                           Symptoms seem to be consistent with just with swelling due to the scalp infection.  No evidence of any secondary infection.  Continue the Keflex.  Follow-up with urgent care.   Final Clinical Impression(s) / ED Diagnoses Final diagnoses:  Right facial swelling  Folliculitis    Rx / DC Orders ED Discharge Orders     None        Vanetta Mulders, MD 04/09/21 1659

## 2021-04-17 ENCOUNTER — Ambulatory Visit
Admission: RE | Admit: 2021-04-17 | Discharge: 2021-04-17 | Disposition: A | Discharge status: Home / Self Care | Payer: Managed Care, Other (non HMO) | Source: Ambulatory Visit | Attending: Family Medicine | Admitting: Family Medicine

## 2021-04-17 ENCOUNTER — Other Ambulatory Visit: Payer: Self-pay

## 2021-04-17 DIAGNOSIS — Z1231 Encounter for screening mammogram for malignant neoplasm of breast: Secondary | ICD-10-CM

## 2021-05-13 ENCOUNTER — Ambulatory Visit (HOSPITAL_COMMUNITY)
Admission: EM | Admit: 2021-05-13 | Discharge: 2021-05-13 | Disposition: A | Discharge status: Home / Self Care | Payer: Managed Care, Other (non HMO) | Attending: Emergency Medicine | Admitting: Emergency Medicine

## 2021-05-13 ENCOUNTER — Encounter (HOSPITAL_COMMUNITY): Payer: Self-pay | Admitting: Emergency Medicine

## 2021-05-13 ENCOUNTER — Other Ambulatory Visit: Payer: Self-pay

## 2021-05-13 DIAGNOSIS — I10 Essential (primary) hypertension: Secondary | ICD-10-CM

## 2021-05-13 DIAGNOSIS — Z9119 Patient's noncompliance with other medical treatment and regimen: Secondary | ICD-10-CM

## 2021-05-13 DIAGNOSIS — Z91199 Patient's noncompliance with other medical treatment and regimen due to unspecified reason: Secondary | ICD-10-CM

## 2021-05-13 DIAGNOSIS — H00014 Hordeolum externum left upper eyelid: Secondary | ICD-10-CM | POA: Diagnosis not present

## 2021-05-13 MED ORDER — POLYMYXIN B-TRIMETHOPRIM 10000-0.1 UNIT/ML-% OP SOLN: 1.0000 [drp] | OPHTHALMIC | 0 refills | Status: DC

## 2021-05-13 NOTE — ED Triage Notes (Signed)
Pt presents with left eye swelling xs 2 days.

## 2021-05-13 NOTE — ED Provider Notes (Signed)
[facility]    CSN: [id number] Arrival date & time: 05/13/21  1404      History   Chief Complaint Chief Complaint  Patient presents with   Facial Swelling    HPI Lisa Cross is a 50 y.o. female.   Patient complains of 3 days of swelling of left eyelid near margin as well as over her eye.  Patient denies visual disturbance and frank eye pain but adds that her eyelid is tender to touch.  Patient denies increased discharge from eyes.  Patient denies recent upper respiratory illness.    The history is provided by the patient.  Eye Problem Location:  Left eye Quality: Tender. Severity:  Mild Onset quality:  Gradual Duration:  3 days Progression:  Unchanged Chronicity:  New Relieved by:  Nothing Worsened by:  Nothing Associated symptoms: inflammation, redness and swelling   Associated symptoms: no blurred vision, no crusting, no decreased vision, no discharge, no double vision, no facial rash, no headaches, no itching, no nausea, no numbness, no photophobia, no scotomas, no tearing, no tingling, no vomiting and no weakness   Risk factors: no conjunctival hemorrhage, no exposure to pinkeye, no previous injury to eye, no recent herpes zoster and no recent URI    Past Medical History:  Diagnosis Date   Anemia     Patient Active Problem List   Diagnosis Date Noted   Essential hypertension 02/06/2016   Hyperglycemia 02/06/2016    Past Surgical History:  Procedure Laterality Date   ABDOMINAL HYSTERECTOMY     BRAIN SURGERY     CHOLECYSTECTOMY      OB History   No obstetric history on file.      Home Medications    Prior to Admission medications   Medication Sig Start Date End Date Taking? Authorizing Provider  trimethoprim-polymyxin b (POLYTRIM) ophthalmic solution Place 1 drop into the left eye every 4 (four) hours. 05/13/21  Yes Theadora Rama Scales, PA-C  cetirizine (ZYRTEC ALLERGY) 10 MG tablet Take 1 tablet (10 mg total) by mouth daily. 05/30/17    Luevenia Maxin, Mina A, PA-C  metFORMIN (GLUCOPHAGE) 500 MG tablet Take 1 tablet (500 mg total) by mouth daily with breakfast. 05/30/17   Jeanie Sewer, PA-C    Family History Family History  Problem Relation Age of Onset   Hypertension Mother    Hypertension Father    Dementia Maternal Grandfather    Hypertension Paternal Grandmother    Hypertension Paternal Grandfather     Social History Social History   Tobacco Use   Smoking status: Never   Smokeless tobacco: Never  Substance Use Topics   Alcohol use: No    Alcohol/week: 0.0 standard drinks   Drug use: No     Allergies   Patient has no known allergies.   Review of Systems Review of Systems  Eyes:  Positive for redness. Negative for blurred vision, double vision, photophobia, discharge and itching.  Gastrointestinal:  Negative for nausea and vomiting.  Neurological:  Negative for tingling, weakness, numbness and headaches.  All other systems reviewed and are negative.   Physical Exam Triage Vital Signs ED Triage Vitals  Enc Vitals Group     BP      Pulse      Resp      Temp      Temp src      SpO2      Weight      Height      Head Circumference  Peak Flow      Pain Score      Pain Loc      Pain Edu?      Excl. in [place]?    No data found.  Updated Vital Signs BP (!) 165/104 (BP Location: Left Arm)   Pulse 100   Temp 98.3 F (36.8 C) (Oral)   Resp 16   SpO2 98%   Visual Acuity Right Eye Distance:   Left Eye Distance:   Bilateral Distance:    Right Eye Near:   Left Eye Near:    Bilateral Near:     Physical Exam Constitutional:      Appearance: Normal appearance.  HENT:     Head: Normocephalic and atraumatic.     Right Ear: Tympanic membrane, ear canal and external ear normal.     Left Ear: Tympanic membrane, ear canal and external ear normal.     Nose: Nose normal.     Mouth/Throat:     Mouth: Mucous membranes are moist.     Pharynx: Oropharynx is clear. No oropharyngeal exudate or  posterior oropharyngeal erythema.  Eyes:     General: No scleral icterus.       Right eye: No discharge.        Left eye: Hordeolum present.No discharge.     Extraocular Movements: Extraocular movements intact.     Conjunctiva/sclera: Conjunctivae normal.     Right eye: Right conjunctiva is not injected. No chemosis, exudate or hemorrhage.    Left eye: Left conjunctiva is not injected. No chemosis, exudate or hemorrhage.    Pupils: Pupils are equal, round, and reactive to light.  Neurological:     Mental Status: She is alert.     UC Treatments / Results  Labs (all labs ordered are listed, but only abnormal results are displayed) Labs Reviewed - No data to display  EKG   Radiology No results found.  Procedures Procedures (including critical care time)  Medications Ordered in UC Medications - No data to display  Initial Impression / Assessment and Plan / UC Course  I have reviewed the triage vital signs and the nursing notes.  Pertinent labs & imaging results that were available during my care of the patient were reviewed by me and considered in my medical decision making (see chart for details).     Given patient's recent history of cellulitis on her head, in addition to conservative care with warm compresses 4 times daily I will prescribe Polytrim eyedrops for her.  Patient advised that is about 2 weeks.  Patient states that she has been prescribed blood pressure medications for her very elevated blood pressure but to date has not tried them, is not interested in taking them.  Had a long discussion with patient regarding the risks of not taking blood pressure medications including heart failure, renal failure.  Patient verbalized understanding. Final Clinical Impressions(s) / UC Diagnoses   Final diagnoses:  Hordeolum externum of left upper eyelid  Essential hypertension  Patient's noncompliance with other medical treatment and regimen     Discharge Instructions       As we discussed I will be prescribing an eyedrop for you for your left eye.  He can certainly use it in both eyes if you like.  The typical duration of hordeolum is approximately 2 weeks.  The mainstay of therapy is warm compresses to the eyes at least 4 times daily.  Please consider taking your blood pressure medicine to keep your blushed blood pressure  much better controlled than it currently is right now in order to avoid kidney and heart failure in the near future.     ED Prescriptions     Medication Sig Dispense Auth. Provider   trimethoprim-polymyxin b (POLYTRIM) ophthalmic solution Place 1 drop into the left eye every 4 (four) hours. 10 mL Theadora Rama Scales, PA-C      PDMP not reviewed this encounter.   Theadora Rama Scales, PA-C 05/13/21 1526

## 2021-05-13 NOTE — Discharge Instructions (Addendum)
As we discussed I will be prescribing an eyedrop for you for your left eye.  He can certainly use it in both eyes if you like.  The typical duration of hordeolum is approximately 2 weeks.  The mainstay of therapy is warm compresses to the eyes at least 4 times daily.  Please consider taking your blood pressure medicine to keep your blushed blood pressure much better controlled than it currently is right now in order to avoid kidney and heart failure in the near future.

## 2021-10-27 IMAGING — MG MM DIGITAL SCREENING BILAT W/ TOMO AND CAD
8 series · 8 of 24 positions shown · non-contrast
Comparison: Previous exam(s).

CLINICAL DATA: Screening.

EXAM:
DIGITAL SCREENING BILATERAL MAMMOGRAM WITH TOMOSYNTHESIS AND CAD
TECHNIQUE: Bilateral screening digital craniocaudal and mediolateral oblique
mammograms were obtained. Bilateral screening digital breast
tomosynthesis was performed. The images were evaluated with
computer-aided detection.

[R MLO synth-2D]
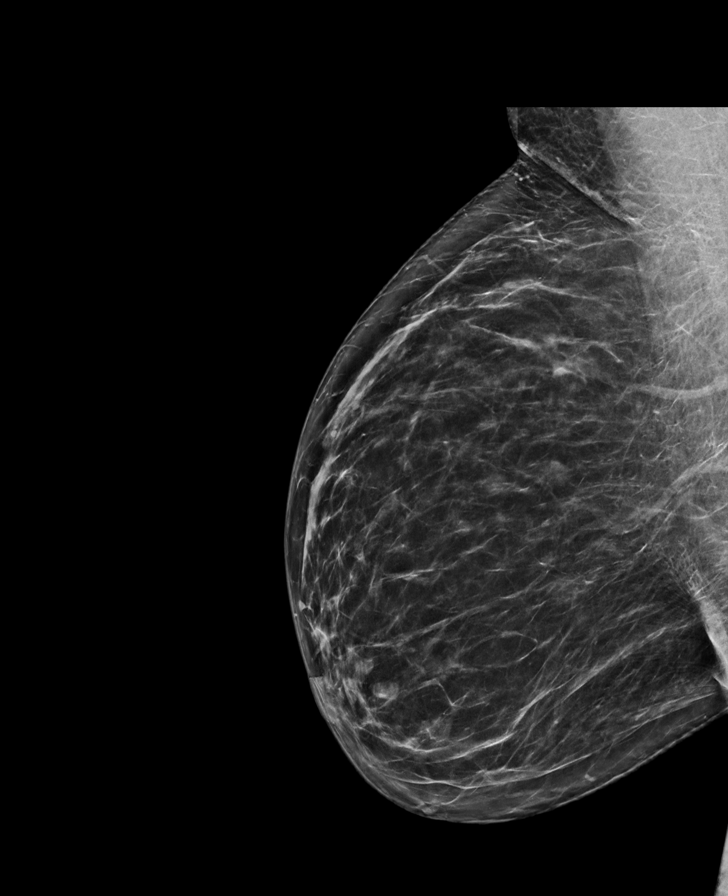

[R CC synth-2D]
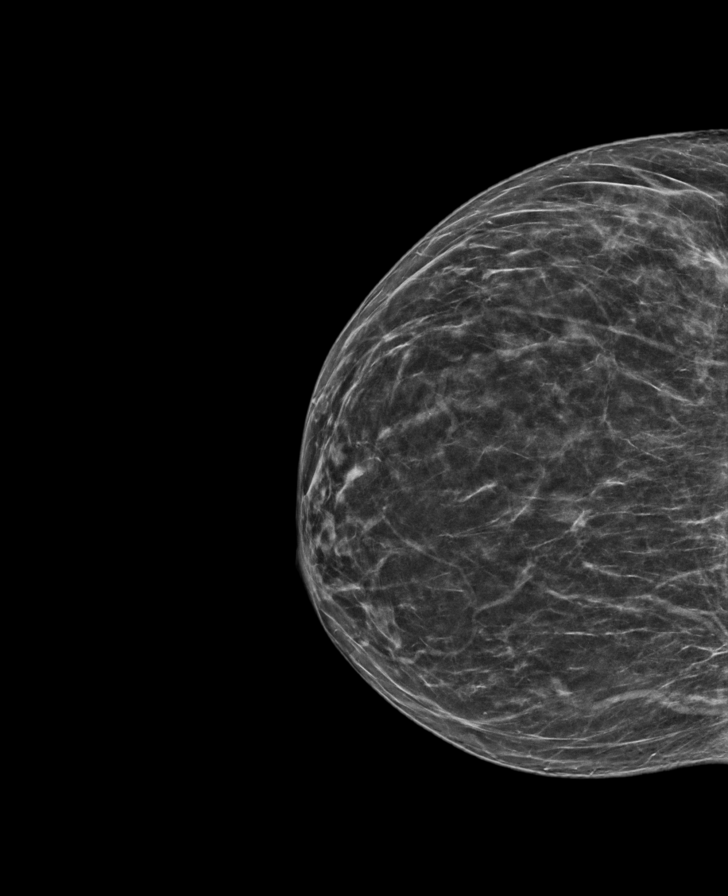

[L MLO synth-2D]
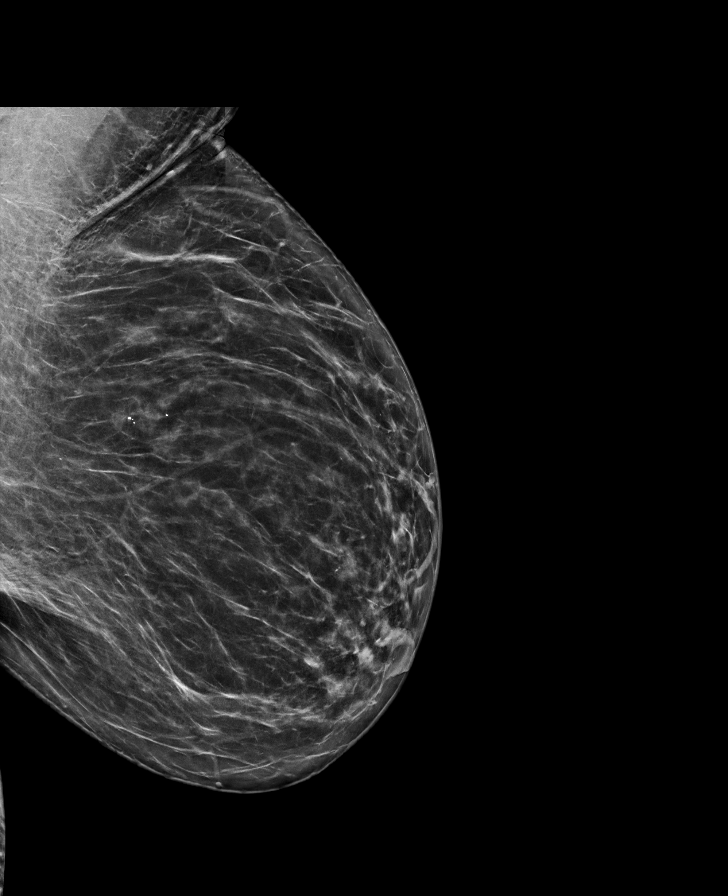

[L CC synth-2D]
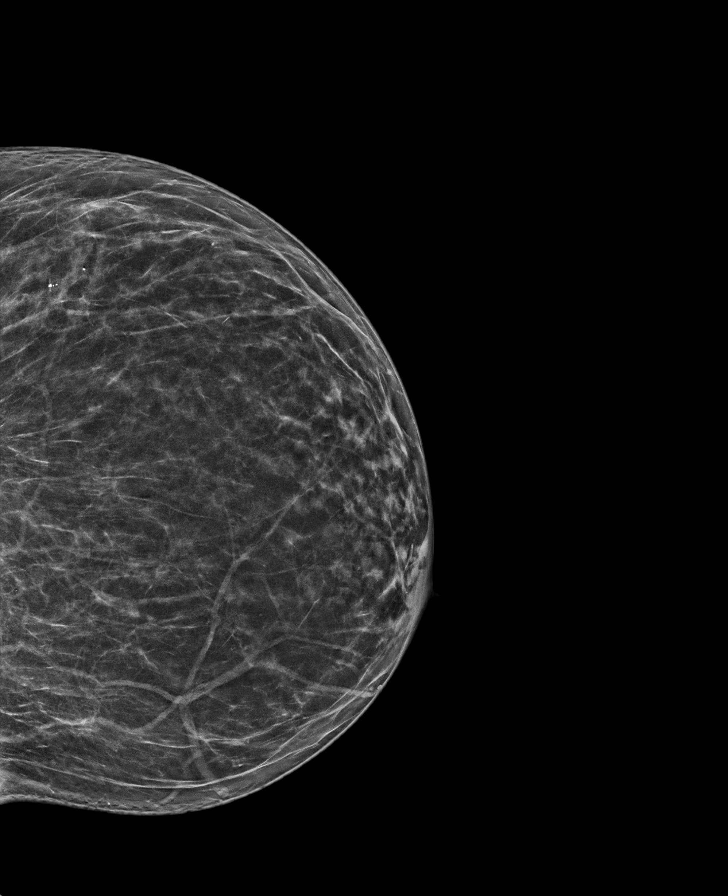

[R CC tomo · tomo slice 32/63.0]
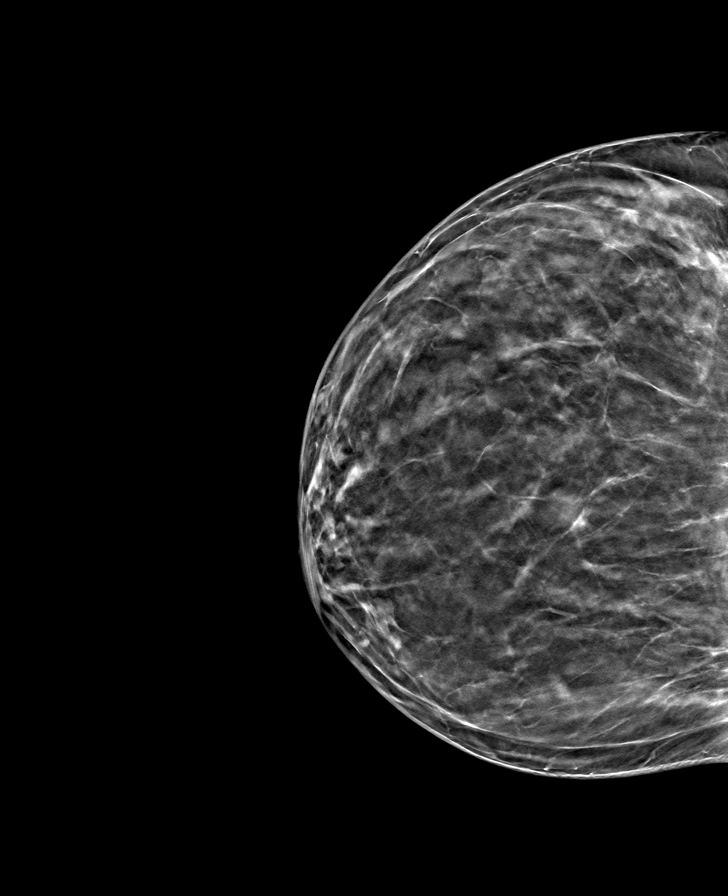

[L CC tomo · tomo slice 33/64.0]
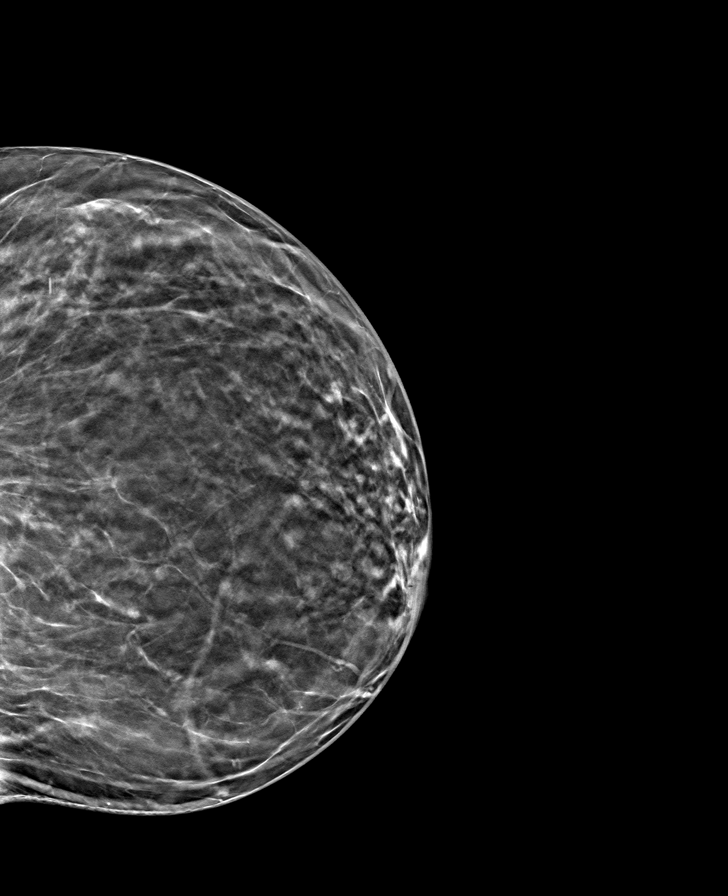

[L MLO tomo · tomo slice 40/79.0]
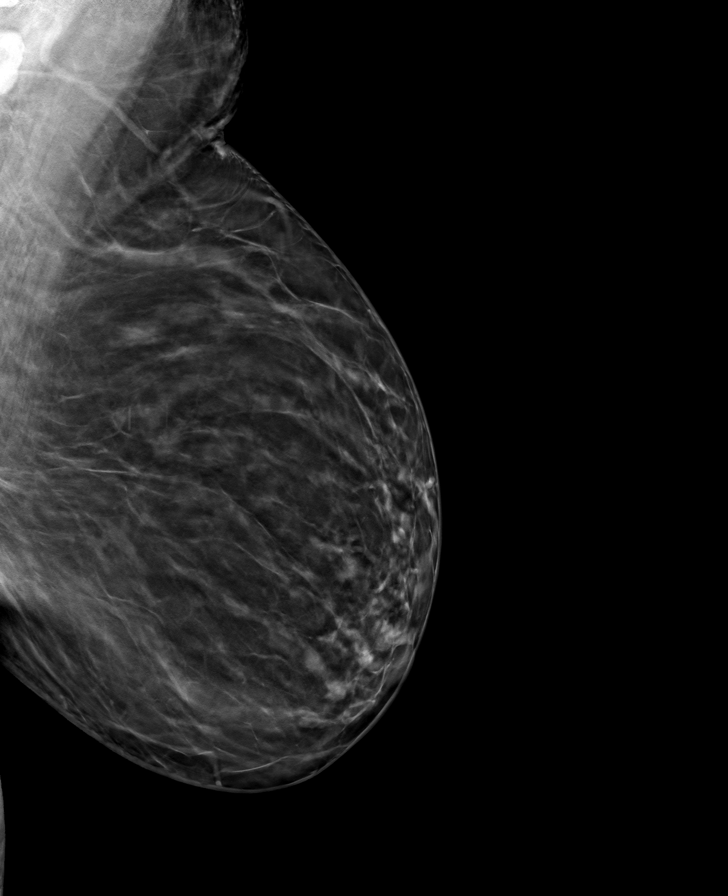

[R MLO tomo · tomo slice 37/74.0]
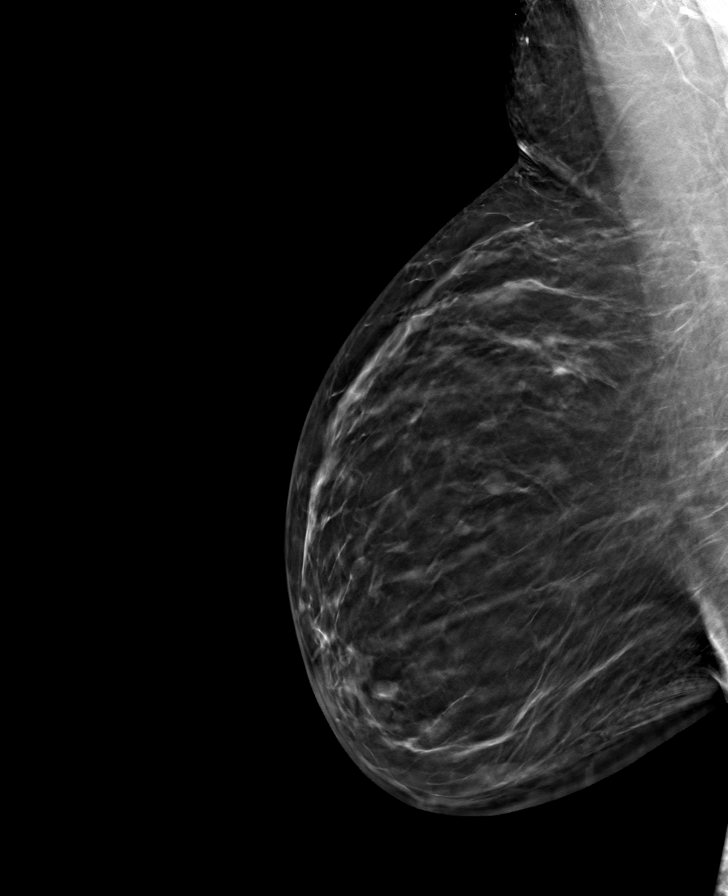

[8 of 24 positions shown; findings below may reference images not displayed]

ACR Breast Density Category b: There are scattered areas of
fibroglandular density.
FINDINGS: There are no findings suspicious for malignancy.
IMPRESSION: No mammographic evidence of malignancy. A result letter of this
screening mammogram will be mailed directly to the patient.

RECOMMENDATION:
Screening mammogram in one year. (Code:51-O-LD2)

BI-RADS CATEGORY  1: Negative.

## 2021-12-31 ENCOUNTER — Other Ambulatory Visit: Payer: Self-pay

## 2021-12-31 ENCOUNTER — Encounter (HOSPITAL_COMMUNITY): Payer: Self-pay | Admitting: Emergency Medicine

## 2021-12-31 ENCOUNTER — Emergency Department (HOSPITAL_COMMUNITY)
Admission: EM | Admit: 2021-12-31 | Discharge: 2021-12-31 | Disposition: A | Discharge status: Home / Self Care | Payer: No Typology Code available for payment source | Attending: Emergency Medicine | Admitting: Emergency Medicine

## 2021-12-31 DIAGNOSIS — Z77098 Contact with and (suspected) exposure to other hazardous, chiefly nonmedicinal, chemicals: Secondary | ICD-10-CM | POA: Diagnosis not present

## 2021-12-31 DIAGNOSIS — Y990 Civilian activity done for income or pay: Secondary | ICD-10-CM | POA: Insufficient documentation

## 2021-12-31 DIAGNOSIS — H5789 Other specified disorders of eye and adnexa: Secondary | ICD-10-CM

## 2021-12-31 MED ORDER — TETRACAINE HCL 0.5 % OP SOLN
2.0000 [drp] | Freq: Once | OPHTHALMIC | Status: AC
  Administered 2021-12-31: 2 [drp] via OPHTHALMIC
  Filled 2021-12-31 (×2): qty 4

## 2021-12-31 MED ORDER — FLUORESCEIN SODIUM 1 MG OP STRP
1.0000 | ORAL_STRIP | Freq: Once | OPHTHALMIC | Status: AC
  Administered 2021-12-31: 1 via OPHTHALMIC
  Filled 2021-12-31 (×2): qty 1

## 2021-12-31 NOTE — ED Notes (Signed)
Pt verbalizes understanding of discharge instructions. Opportunity for questions and answers were provided. Pt discharged from the ED.   ?

## 2021-12-31 NOTE — ED Provider Triage Note (Signed)
Emergency Medicine Provider Triage Evaluation Note ? ?Lisa Cross , a 51 y.o. female  was evaluated in triage.  Pt complains of some kind of car cleaning solution that splashed her eyes just prior to arrival.  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  She reports that she washed out her eyes for around 5 to 7 minutes before coming to the emergency department.  She endorses some burning in the left eye but denies significant pain.  She denies significant vision changes.  Incidentally patient found to be quite hypertensive on arrival, she reports she has a known history of hypertension, but is not on any medication at this time.  She denies any chest pain, headache, shortness of breath. ? ?Review of Systems  ?Positive: Visual disturbance, burning ?Negative: Pain, photophobia ? ?Physical Exam  ?BP (!) 212/109 (BP Location: Right Arm)   Pulse (!) 103   Temp 98.5 ?F (36.9 ?C) (Oral)   Resp 14   SpO2 100%  ?Gen:   Awake, no distress   ?Resp:  Normal effort  ?MSK:   Moves extremities without difficulty  ?Other:  Pupils equal, round, reactive, no obvious corneal defect noted, no significant conjunctival irritation ? ?Medical Decision Making  ?Medically screening exam initiated at 12:51 PM.  Appropriate orders placed.  Lisa Cross was informed that the remainder of the evaluation will be completed by another provider, this initial triage assessment does not replace that evaluation, and the importance of remaining in the ED until their evaluation is complete. ? ?Patient will need pH strip, fluoroscein exam, additional eye flushing ?  ?Anselmo Pickler, PA-C ?12/31/21 1254 ? ?

## 2021-12-31 NOTE — ED Triage Notes (Signed)
Pt states that she was at work and dropped a box of cleaning solution and splashed into eyes. States that she was able to flush her eyes.  ?

## 2021-12-31 NOTE — Discharge Instructions (Addendum)
You were seen today for chemical exposure to the eyes. No corneal abrasion or pH abnormality was noted. Recommend follow up with eye care for further evaluation and management as needed.  ?

## 2021-12-31 NOTE — ED Provider Notes (Signed)
?[facility] EMERGENCY DEPARTMENT ?Provider Note ? ? ?CSN: [id number] ?Arrival date & time: 12/31/21  1242 ? ?  ? ?History ? ?Chief Complaint  ?Patient presents with  ? Foreign Body in Eye  ? ? ?Lisa Cross is a 51 y.o. female.  Patient presents to the hospital complaining of chemical irritation to bilateral eyes.  Patient states that she was at work and dropped a box of an unknown cleaning solution to the ground which splashed into her eyes.  She states that she had to go down to [place] to get to an eyewash station and rubbed her eyes in between floors.  She states that she flushed her eyes for 5 to 10 minutes once reaching the ground floor.  Complains of mild tingling to the left eye. ? ?HPI ? ?  ? ?Home Medications ?Prior to Admission medications   ?Medication Sig Start Date End Date Taking? Authorizing Provider  ?cetirizine (ZYRTEC ALLERGY) 10 MG tablet Take 1 tablet (10 mg total) by mouth daily. 05/30/17   Luevenia Maxin, Mina A, PA-C  ?metFORMIN (GLUCOPHAGE) 500 MG tablet Take 1 tablet (500 mg total) by mouth daily with breakfast. 05/30/17   Michela Pitcher A, PA-C  ?trimethoprim-polymyxin b (POLYTRIM) ophthalmic solution Place 1 drop into the left eye every 4 (four) hours. 05/13/21   Theadora Rama Scales, PA-C  ?   ? ?Allergies    ?Patient has no known allergies.   ? ?Review of Systems   ?Review of Systems  ?Eyes:  Positive for pain and redness.  ? ?Physical Exam ?Updated Vital Signs ?BP (!) 167/101   Pulse 95   Temp 98.5 ?F (36.9 ?C) (Oral)   Resp 15   Ht 5\' 5"  (1.651 m)   Wt 74.8 kg   SpO2 99%   BMI 27.46 kg/m?  ?Physical Exam ?Vitals and nursing note reviewed.  ?Constitutional:   ?   General: She is not in acute distress. ?HENT:  ?   Head: Normocephalic and atraumatic.  ?Eyes:  ?   General: Vision grossly intact.  ?   Extraocular Movements: Extraocular movements intact.  ?   Conjunctiva/sclera:  ?   Right eye: Right conjunctiva is injected.  ?   Left eye: Left conjunctiva is injected.  ?    Pupils: Pupils are equal, round, and reactive to light.  ?   Right eye: No corneal abrasion or fluorescein uptake. Seidel exam negative.  ?   Left eye: No corneal abrasion or fluorescein uptake. Seidel exam negative. ?   Comments: pH 7.0  ?Neurological:  ?   Mental Status: She is alert.  ? ? ?ED Results / Procedures / Treatments   ?Labs ?(all labs ordered are listed, but only abnormal results are displayed) ?Labs Reviewed - No data to display ? ?EKG ?None ? ?Radiology ?No results found. ? ?Procedures ?Procedures  ? ? ?Medications Ordered in ED ?Medications  ?fluorescein ophthalmic strip 1 strip (1 strip Left Eye Given by Other 12/31/21 1403)  ?tetracaine (PONTOCAINE) 0.5 % ophthalmic solution 2 drop (2 drops Left Eye Given by Other 12/31/21 1403)  ? ? ?ED Course/ Medical Decision Making/ A&P ?  ?                        ?Medical Decision Making ? ?Patient presents with complaints of chemical exposure to bilateral eyes.  Chemical is unknown.  Patient flushed eyes prior to arrival.  01/02/22 lamp exam with fluorescein stain shows no corneal abrasion at  this time.  pH 7.0.  Visual acuity grossly equal bilaterally and grossly intact.  There is no reason for further work-up at this time in the emergent setting.  Plan to discharge home with information for ophthalmology follow-up. ? ?Final Clinical Impression(s) / ED Diagnoses ?Final diagnoses:  ?Eye irritation  ?Chemical exposure of eye  ? ? ?Rx / DC Orders ?ED Discharge Orders   ? ? None  ? ?  ? ? ?  ?Darrick Grinder, PA-C ?12/31/21 1457 ? ?  ?Benjiman Core, MD ?12/31/21 1618 ? ?

## 2022-03-11 ENCOUNTER — Ambulatory Visit (HOSPITAL_COMMUNITY)
Admission: EM | Admit: 2022-03-11 | Discharge: 2022-03-11 | Disposition: A | Discharge status: Home / Self Care | Payer: Managed Care, Other (non HMO)

## 2022-03-11 ENCOUNTER — Encounter (HOSPITAL_COMMUNITY): Payer: Self-pay

## 2022-03-11 DIAGNOSIS — L089 Local infection of the skin and subcutaneous tissue, unspecified: Secondary | ICD-10-CM | POA: Diagnosis not present

## 2022-03-11 MED ORDER — MUPIROCIN 2 % EX OINT
1.0000 | TOPICAL_OINTMENT | Freq: Two times a day (BID) | CUTANEOUS | 0 refills | Status: DC
Start: 2022-03-11 — End: 2022-12-15

## 2022-03-11 MED ORDER — CEPHALEXIN 500 MG PO CAPS: 500.0000 mg | ORAL_CAPSULE | Freq: Three times a day (TID) | ORAL | 0 refills | Status: DC

## 2022-03-11 NOTE — Discharge Instructions (Addendum)
Please use warm compress several times per day and apply Bactroban ointment.  Keep the area clean with soap and water.  Start Keflex 3 times daily for 5 days.  If at any point this is worsening and it becomes larger, more painful, you develop fever, nausea, vomiting, weakness you need to be seen immediately.

## 2022-03-11 NOTE — ED Triage Notes (Signed)
Pt reports bump on right ear.

## 2022-03-11 NOTE — ED Provider Notes (Signed)
[facility]    CSN: [id number] Arrival date & time: 03/11/22  1358      History   Chief Complaint Chief Complaint  Patient presents with   Mass    HPI Lisa Cross is a 51 y.o. female.   Patient presents today with a painful mass on her right external ear.  Reports that she noticed that yesterday and has gradually been enlarging prompting evaluation.  Pain is minimal at rest (rated 2 on a 0-10 pain scale) but is tender to touch.  She denies any change in her hearing.  Denies history of recurrent skin infections or MRSA.  Denies any recent antibiotic use.  She has not done anything to help manage symptoms.  She denies any systemic symptoms including fever, nausea, vomiting, chest pain, shortness of breath.    Past Medical History:  Diagnosis Date   Anemia     Patient Active Problem List   Diagnosis Date Noted   Essential hypertension 02/06/2016   Hyperglycemia 02/06/2016    Past Surgical History:  Procedure Laterality Date   ABDOMINAL HYSTERECTOMY     BRAIN SURGERY     CHOLECYSTECTOMY      OB History   No obstetric history on file.      Home Medications    Prior to Admission medications   Medication Sig Start Date End Date Taking? Authorizing Provider  cephALEXin (KEFLEX) 500 MG capsule Take 1 capsule (500 mg total) by mouth 3 (three) times daily. 03/11/22  Yes Brain Honeycutt K, PA-C  lisinopril (ZESTRIL) 20 MG tablet Take by mouth. 12/01/18  Yes [provider]  mupirocin ointment (BACTROBAN) 2 % Apply 1 Application topically 2 (two) times daily. 03/11/22  Yes Alease Fait, Noberto Retort, PA-C  cetirizine (ZYRTEC ALLERGY) 10 MG tablet Take 1 tablet (10 mg total) by mouth daily. 05/30/17   Luevenia Maxin, Mina A, PA-C  metFORMIN (GLUCOPHAGE) 500 MG tablet Take 1 tablet (500 mg total) by mouth daily with breakfast. 05/30/17   Luevenia Maxin, Mina A, PA-C  trimethoprim-polymyxin b (POLYTRIM) ophthalmic solution Place 1 drop into the left eye every 4 (four) hours. 05/13/21   Theadora Rama Scales, PA-C    Family History Family History  Problem Relation Age of Onset   Hypertension Mother    Hypertension Father    Dementia Maternal Grandfather    Hypertension Paternal Grandmother    Hypertension Paternal Grandfather     Social History Social History   Tobacco Use   Smoking status: Never   Smokeless tobacco: Never  Substance Use Topics   Alcohol use: No    Alcohol/week: 0.0 standard drinks of alcohol   Drug use: No     Allergies   Patient has no known allergies.   Review of Systems Review of Systems  Constitutional:  Negative for activity change, appetite change, fatigue and fever.  HENT:  Negative for congestion, sinus pressure, sneezing and sore throat.   Respiratory:  Negative for cough and shortness of breath.   Cardiovascular:  Negative for chest pain.  Gastrointestinal:  Negative for abdominal pain, diarrhea, nausea and vomiting.  Skin:  Positive for color change and wound.  Neurological:  Negative for dizziness, light-headedness and headaches.     Physical Exam Triage Vital Signs ED Triage Vitals [03/11/22 1459]  Enc Vitals Group     BP (!) 148/91     Pulse Rate 95     Resp 16     Temp 98.1 F (36.7 C)     Temp Source  Oral     SpO2 100 %     Weight      Height      Head Circumference      Peak Flow      Pain Score      Pain Loc      Pain Edu?      Excl. in [place]?    No data found.  Updated Vital Signs BP (!) 148/91 (BP Location: Left Arm)   Pulse 95   Temp 98.1 F (36.7 C) (Oral)   Resp 16   SpO2 100%   Visual Acuity Right Eye Distance:   Left Eye Distance:   Bilateral Distance:    Right Eye Near:   Left Eye Near:    Bilateral Near:     Physical Exam Vitals reviewed.  Constitutional:      General: She is awake. She is not in acute distress.    Appearance: Normal appearance. She is well-developed. She is not ill-appearing.     Comments: Very pleasant female appears stated age in no acute distress sitting  comfortably in exam room  HENT:     Head: Normocephalic and atraumatic.     Right Ear: Tympanic membrane, ear canal and external ear normal. Swelling present.     Left Ear: Tympanic membrane, ear canal and external ear normal.     Ears:      Nose: Nose normal.     Mouth/Throat:     Pharynx: Uvula midline. No oropharyngeal exudate or posterior oropharyngeal erythema.  Cardiovascular:     Rate and Rhythm: Normal rate and regular rhythm.     Heart sounds: Normal heart sounds, S1 normal and S2 normal. No murmur heard. Pulmonary:     Effort: Pulmonary effort is normal.     Breath sounds: Normal breath sounds. No wheezing, rhonchi or rales.     Comments: Clear to auscultation bilaterally Psychiatric:        Behavior: Behavior is cooperative.      UC Treatments / Results  Labs (all labs ordered are listed, but only abnormal results are displayed) Labs Reviewed - No data to display  EKG   Radiology No results found.  Procedures Procedures (including critical care time)  Medications Ordered in UC Medications - No data to display  Initial Impression / Assessment and Plan / UC Course  I have reviewed the triage vital signs and the nursing notes.  Pertinent labs & imaging results that were available during my care of the patient were reviewed by me and considered in my medical decision making (see chart for details).     Discussed potential utility of treating this with needle in clinic but patient declined this due to fear of needles.  Recommended warm compresses and applying Bactroban to encourage spontaneous drainage.  We will start Keflex to cover for infection.  She was encouraged to use Tylenol and ibuprofen for pain.  Discussed that if this lesion does not improve quickly with conservative treatment or if she develops increased pain, swelling, headache, fever, nausea, vomiting she needs to be seen immediately.  Strict return precautions given.  She declined work excuse  note.  Final Clinical Impressions(s) / UC Diagnoses   Final diagnoses:  Pustule     Discharge Instructions      Please use warm compress several times per day and apply Bactroban ointment.  Keep the area clean with soap and water.  Start Keflex 3 times daily for 5 days.  If at any point  this is worsening and it becomes larger, more painful, you develop fever, nausea, vomiting, weakness you need to be seen immediately.     ED Prescriptions     Medication Sig Dispense Auth. Provider   mupirocin ointment (BACTROBAN) 2 % Apply 1 Application topically 2 (two) times daily. 22 g Karuna Balducci K, PA-C   cephALEXin (KEFLEX) 500 MG capsule Take 1 capsule (500 mg total) by mouth 3 (three) times daily. 15 capsule Charnese Federici K, PA-C      PDMP not reviewed this encounter.   Jeani Hawking, PA-C 03/11/22 1527

## 2022-03-19 ENCOUNTER — Other Ambulatory Visit: Payer: Self-pay | Admitting: Family Medicine

## 2022-03-19 DIAGNOSIS — Z1231 Encounter for screening mammogram for malignant neoplasm of breast: Secondary | ICD-10-CM

## 2022-04-19 ENCOUNTER — Ambulatory Visit
Admission: RE | Admit: 2022-04-19 | Discharge: 2022-04-19 | Disposition: A | Discharge status: Home / Self Care | Payer: Managed Care, Other (non HMO) | Source: Ambulatory Visit | Attending: Family Medicine | Admitting: Family Medicine

## 2022-04-19 DIAGNOSIS — Z1231 Encounter for screening mammogram for malignant neoplasm of breast: Secondary | ICD-10-CM

## 2022-10-10 ENCOUNTER — Ambulatory Visit (HOSPITAL_COMMUNITY)
Admission: RE | Admit: 2022-10-10 | Discharge: 2022-10-10 | Disposition: A | Discharge status: Home / Self Care | Payer: Managed Care, Other (non HMO) | Source: Ambulatory Visit | Attending: Physician Assistant | Admitting: Physician Assistant

## 2022-10-10 ENCOUNTER — Ambulatory Visit: Payer: Self-pay

## 2022-10-10 ENCOUNTER — Telehealth (HOSPITAL_COMMUNITY): Payer: Self-pay | Admitting: *Deleted

## 2022-10-10 ENCOUNTER — Encounter (HOSPITAL_COMMUNITY): Payer: Self-pay

## 2022-10-10 ENCOUNTER — Telehealth (HOSPITAL_COMMUNITY): Payer: Self-pay

## 2022-10-10 VITALS — BP 168/100 | HR 105 | Temp 98.4°F | Resp 18

## 2022-10-10 DIAGNOSIS — H60391 Other infective otitis externa, right ear: Secondary | ICD-10-CM

## 2022-10-10 DIAGNOSIS — H9201 Otalgia, right ear: Secondary | ICD-10-CM

## 2022-10-10 MED ORDER — CIPROFLOXACIN-DEXAMETHASONE 0.3-0.1 % OT SUSP: 4.0000 [drp] | Freq: Two times a day (BID) | OTIC | 0 refills | Status: DC

## 2022-10-10 MED ORDER — CIPROFLOXACIN HCL 0.2 % OT SOLN: 0.2000 mL | Freq: Two times a day (BID) | OTIC | 0 refills | Status: DC

## 2022-10-10 NOTE — Telephone Encounter (Signed)
Patient calling in and states that medication sent in to [facility] is out of stock at that pharmacy. Medication re-sent to [facility] on [address].   Patient verbalized understanding

## 2022-10-10 NOTE — Telephone Encounter (Signed)
TC from [facility] calling to report med is not available. Please order for different med. This was reported to provider Nyoka Lint ,PA

## 2022-10-10 NOTE — ED Provider Notes (Signed)
[place]    CSN: [id number] Arrival date & time: 10/10/22  1044      History   Chief Complaint Chief Complaint  Patient presents with   Ear Fullness    Entered by patient    HPI Lisa Cross is a 52 y.o. female.   52 year old female presents with right your congestion.  Patient indicates for the past couple days she has been having some right ear congestion, fullness sensation.  She relates last night that the ear popped and she started having draining from the ear.  She indicates she has not had any cough, congestion, fever, chills, minimal pain from the right ear.  Patient indicates that she has not had to take ibuprofen or Motrin for any discomfort.  She is tolerating fluids well. Patient also indicates that she has had some off-balance sensation that have been present for the past 5 to 6 months.  She relates that when she walks it feels like that she is leaning 1 way or the other.  Patient indicates that she will make an appointment with her family practice physician to have this evaluated within the next week.  She is without headache, vision changes, nausea or vomiting.   Ear Fullness    Past Medical History:  Diagnosis Date   Anemia     Patient Active Problem List   Diagnosis Date Noted   Noncompliance by refusing intervention or support 04/28/2019   Diabetes mellitus ([place]) 12/01/2018   Noncompliance with medications 10/30/2018   Essential hypertension 02/06/2016   Hyperglycemia 02/06/2016   Uncontrolled hypertension 08/28/2015   Elevated blood sugar 08/28/2015    Past Surgical History:  Procedure Laterality Date   ABDOMINAL HYSTERECTOMY     BRAIN SURGERY     CHOLECYSTECTOMY      OB History   No obstetric history on file.      Home Medications    Prior to Admission medications   Medication Sig Start Date End Date Taking? Authorizing Provider  Ciprofloxacin HCl 0.2 % otic solution Place 0.2 mLs into the right ear 2 (two) times daily.  10/10/22  Yes Nyoka Lint, PA-C  cephALEXin (KEFLEX) 500 MG capsule Take 1 capsule (500 mg total) by mouth 3 (three) times daily. 03/11/22   Raspet, Derry Skill, PA-C  cetirizine (ZYRTEC ALLERGY) 10 MG tablet Take 1 tablet (10 mg total) by mouth daily. 05/30/17   Nils Flack, Mina A, PA-C  lisinopril (ZESTRIL) 20 MG tablet Take by mouth. 12/01/18   [provider]  metFORMIN (GLUCOPHAGE) 500 MG tablet Take 1 tablet (500 mg total) by mouth daily with breakfast. 05/30/17   Rodell Perna A, PA-C  mupirocin ointment (BACTROBAN) 2 % Apply 1 Application topically 2 (two) times daily. 03/11/22   Raspet, Derry Skill, PA-C  trimethoprim-polymyxin b (POLYTRIM) ophthalmic solution Place 1 drop into the left eye every 4 (four) hours. 05/13/21   Lynden Oxford Scales, PA-C    Family History Family History  Problem Relation Age of Onset   Hypertension Mother    Hypertension Father    Dementia Maternal Grandfather    Hypertension Paternal Grandmother    Hypertension Paternal Grandfather     Social History Social History   Tobacco Use   Smoking status: Never   Smokeless tobacco: Never  Substance Use Topics   Alcohol use: No    Alcohol/week: 0.0 standard drinks of alcohol   Drug use: No     Allergies   Patient has no known allergies.   Review of  Systems Review of Systems  HENT:  Positive for ear discharge (right ear, clear).      Physical Exam Triage Vital Signs ED Triage Vitals  Enc Vitals Group     BP 10/10/22 1120 (!) 168/100     Pulse Rate 10/10/22 1120 (!) 105     Resp 10/10/22 1120 18     Temp 10/10/22 1120 98.4 F (36.9 C)     Temp Source 10/10/22 1120 Oral     SpO2 10/10/22 1120 98 %     Weight --      Height --      Head Circumference --      Peak Flow --      Pain Score 10/10/22 1124 3     Pain Loc --      Pain Edu? --      Excl. in [place]? --    No data found.  Updated Vital Signs BP (!) 168/100 (BP Location: Left Arm)   Pulse (!) 105   Temp 98.4 F (36.9 C) (Oral)   Resp 18    SpO2 98%   Visual Acuity Right Eye Distance:   Left Eye Distance:   Bilateral Distance:    Right Eye Near:   Left Eye Near:    Bilateral Near:     Physical Exam Constitutional:      Appearance: Normal appearance.  HENT:     Right Ear: Tympanic membrane normal. Swelling (canal edematous) present.     Left Ear: Tympanic membrane and ear canal normal.     Mouth/Throat:     Mouth: Mucous membranes are moist.     Pharynx: Oropharynx is clear. No posterior oropharyngeal erythema.  Cardiovascular:     Rate and Rhythm: Normal rate and regular rhythm.     Heart sounds: Normal heart sounds.  Pulmonary:     Effort: Pulmonary effort is normal.     Breath sounds: Normal breath sounds and air entry. No wheezing, rhonchi or rales.  Lymphadenopathy:     Cervical: No cervical adenopathy.  Neurological:     Mental Status: She is alert.      UC Treatments / Results  Labs (all labs ordered are listed, but only abnormal results are displayed) Labs Reviewed - No data to display  EKG   Radiology No results found.  Procedures Procedures (including critical care time)  Medications Ordered in UC Medications - No data to display  Initial Impression / Assessment and Plan / UC Course  I have reviewed the triage vital signs and the nursing notes.  Pertinent labs & imaging results that were available during my care of the patient were reviewed by me and considered in my medical decision making (see chart for details).    Plan: The diagnosis will be treated with the following: 1.  Otitis externa right right ear: A.  Cipro otic solution, 2 drops in the ear twice daily until condition clears. 2.  Otalgia right ear: A.  Advised take ibuprofen or Motrin as needed for pain. 3.  Patient advised to follow-up with PCP to address off-balance sensation. 4.  Return to urgent care as needed. Final Clinical Impressions(s) / UC Diagnoses   Final diagnoses:  Infective otitis externa of right  ear  Otalgia, right     Discharge Instructions      Advised to use the Cipro otic solution, 2 drops in the right ear twice daily for the next several days until the condition clears.  He Advised take ibuprofen or  Motrin only as needed for pain.  Advised follow-up PCP or return to urgent care as needed.    ED Prescriptions     Medication Sig Dispense Auth. Provider   Ciprofloxacin HCl 0.2 % otic solution Place 0.2 mLs into the right ear 2 (two) times daily. 30 each Nyoka Lint, PA-C      PDMP not reviewed this encounter.   Nyoka Lint, PA-C 10/10/22 1135

## 2022-10-10 NOTE — Discharge Instructions (Signed)
Advised to use the Cipro otic solution, 2 drops in the right ear twice daily for the next several days until the condition clears.  He Advised take ibuprofen or Motrin only as needed for pain.  Advised follow-up PCP or return to urgent care as needed.

## 2022-10-10 NOTE — ED Triage Notes (Signed)
Pt presents to the office for ear fullness in left ear.

## 2022-12-15 ENCOUNTER — Encounter (HOSPITAL_COMMUNITY): Payer: Self-pay

## 2022-12-15 ENCOUNTER — Ambulatory Visit (HOSPITAL_COMMUNITY)
Admission: EM | Admit: 2022-12-15 | Discharge: 2022-12-15 | Disposition: A | Discharge status: Home / Self Care | Payer: Managed Care, Other (non HMO) | Attending: Emergency Medicine | Admitting: Emergency Medicine

## 2022-12-15 DIAGNOSIS — I10 Essential (primary) hypertension: Secondary | ICD-10-CM

## 2022-12-15 DIAGNOSIS — H60501 Unspecified acute noninfective otitis externa, right ear: Secondary | ICD-10-CM

## 2022-12-15 MED ORDER — OFLOXACIN 0.3 % OT SOLN: 5.0000 [drp] | Freq: Two times a day (BID) | OTIC | 0 refills | Status: AC

## 2022-12-15 MED ORDER — OFLOXACIN 0.3 % OT SOLN: 5.0000 [drp] | Freq: Two times a day (BID) | OTIC | 0 refills | Status: DC

## 2022-12-15 NOTE — ED Triage Notes (Signed)
Pt presents with complaints of right ear pain x 1 week. Reports she feels 3 bumps in there.

## 2022-12-15 NOTE — ED Provider Notes (Signed)
[facility]    CSN: [id number] Arrival date & time: 12/15/22  1715      History   Chief Complaint Chief Complaint  Patient presents with   Otalgia    HPI Lisa Cross is a 52 y.o. female.   Patient presents to clinic for continued right ear pain and discharge.  She was seen in February and diagnosed with external ear infection.  She used her antibiotic drops and had a Abelson relief, however, she did not experience full resolution.  She tried to wait it out to see if it would improve and has not.  She had yellow, pink drainage from it yesterday.  She denies fevers.  She has been cleaning out her ears with Q-tips.  Her left ear is not impacted.  She does wear a headset at work on her right ear.  Reports a history of hypertension, is not currently on lisinopril or any other blood pressure medications, does not currently have a primary care provider.  The history is provided by the patient and medical records.  Otalgia Associated symptoms: ear discharge   Associated symptoms: no abdominal pain, no cough, no fever and no headaches     Past Medical History:  Diagnosis Date   Anemia     Patient Active Problem List   Diagnosis Date Noted   Noncompliance by refusing intervention or support 04/28/2019   Diabetes mellitus 12/01/2018   Noncompliance with medications 10/30/2018   Essential hypertension 02/06/2016   Hyperglycemia 02/06/2016   Uncontrolled hypertension 08/28/2015   Elevated blood sugar 08/28/2015    Past Surgical History:  Procedure Laterality Date   ABDOMINAL HYSTERECTOMY     BRAIN SURGERY     CHOLECYSTECTOMY      OB History   No obstetric history on file.      Home Medications    Prior to Admission medications   Medication Sig Start Date End Date Taking? Authorizing Provider  lisinopril (ZESTRIL) 20 MG tablet Take by mouth. 12/01/18   [provider]  metFORMIN (GLUCOPHAGE) 500 MG tablet Take 1 tablet (500 mg total) by mouth  daily with breakfast. 05/30/17   Michela Pitcher A, PA-C  ofloxacin (FLOXIN) 0.3 % OTIC solution Place 5 drops into the right ear 2 (two) times daily for 7 days. 12/15/22 12/22/22  Jermane Brayboy, [place] N, FNP    Family History Family History  Problem Relation Age of Onset   Hypertension Mother    Hypertension Father    Dementia Maternal Grandfather    Hypertension Paternal Grandmother    Hypertension Paternal Grandfather     Social History Social History   Tobacco Use   Smoking status: Never   Smokeless tobacco: Never  Substance Use Topics   Alcohol use: No    Alcohol/week: 0.0 standard drinks of alcohol   Drug use: No     Allergies   Patient has no known allergies.   Review of Systems Review of Systems  Constitutional:  Negative for fatigue and fever.  HENT:  Positive for ear discharge and ear pain.   Eyes:  Negative for discharge.  Respiratory:  Negative for cough and shortness of breath.   Cardiovascular:  Negative for chest pain.  Gastrointestinal:  Negative for abdominal pain.  Genitourinary:  Negative for dysuria.  Neurological:  Negative for syncope and headaches.     Physical Exam Triage Vital Signs ED Triage Vitals  Enc Vitals Group     BP 12/15/22 1838 (!) 172/105     Pulse Rate  12/15/22 1838 (!) 102     Resp 12/15/22 1838 19     Temp 12/15/22 1838 98.5 F (36.9 C)     Temp src --      SpO2 12/15/22 1838 98 %     Weight --      Height --      Head Circumference --      Peak Flow --      Pain Score 12/15/22 1837 1     Pain Loc --      Pain Edu? --      Excl. in [place]? --    No data found.  Updated Vital Signs BP (!) 172/105   Pulse (!) 102   Temp 98.5 F (36.9 C)   Resp 19   SpO2 98%   Visual Acuity Right Eye Distance:   Left Eye Distance:   Bilateral Distance:    Right Eye Near:   Left Eye Near:    Bilateral Near:     Physical Exam Vitals and nursing note reviewed.  Constitutional:      General: She is not in acute distress.     Appearance: She is well-developed.  HENT:     Head: Normocephalic and atraumatic.     Right Ear: Drainage, swelling and tenderness present.     Left Ear: Tympanic membrane, ear canal and external ear normal.     Ears:     Comments: Right external auditory canal with drainage, swelling and tenderness.  Unable to fully visualize the tympanic membrane due to canal swelling.    Nose: Nose normal.     Mouth/Throat:     Mouth: Mucous membranes are moist.  Eyes:     General: No scleral icterus.       Right eye: No discharge.        Left eye: No discharge.     Conjunctiva/sclera: Conjunctivae normal.  Cardiovascular:     Rate and Rhythm: Normal rate and regular rhythm.  Pulmonary:     Effort: Pulmonary effort is normal. No respiratory distress.  Musculoskeletal:        General: No swelling. Normal range of motion.  Skin:    General: Skin is warm and dry.     Capillary Refill: Capillary refill takes less than 2 seconds.  Neurological:     Mental Status: She is alert.  Psychiatric:        Mood and Affect: Mood normal.        Behavior: Behavior is cooperative.      UC Treatments / Results  Labs (all labs ordered are listed, but only abnormal results are displayed) Labs Reviewed - No data to display  EKG   Radiology No results found.  Procedures Procedures (including critical care time)  Medications Ordered in UC Medications - No data to display  Initial Impression / Assessment and Plan / UC Course  I have reviewed the triage vital signs and the nursing notes.  Pertinent labs & imaging results that were available during my care of the patient were reviewed by me and considered in my medical decision making (see chart for details).  Vitals in triage reviewed, patient is hemodynamically stable.  Appears to have a repeat right otitis externa.  Will cover with ofloxacin drops due to inability to fully visualize tympanic membrane.  Encouraged to follow-up with ENT if no  improvement over the next 7 days on antibiotics.  Advised to stop using Q-tips and to switch out her headset, and to switch  headset to left side.  Patient verbalized understanding of ENT follow-up and follow-up care, no questions at this time.  Patient is hypertensive on exam, asymptomatic.  Appears similar to previous visits.  Does not currently have a primary care provider, encouraged to establish 1 for further evaluation.  Patient verbalized understanding, no questions at this time.  Return emergency precautions discussed.    Final Clinical Impressions(s) / UC Diagnoses   Final diagnoses:  Acute otitis externa of right ear, unspecified type  Essential hypertension     Discharge Instructions      You have an external ear infection of your right ear.  I suspect it did not fully resolve after your last ear infection.  Please take the ofloxacin drops, 5 drops twice daily for the next 7 days or 10 drops once daily for 7 days.  Please switch over your ear set to your left ear.  Do not insert Q-tips or attempt to clean your ear as this may worsen your infection.  Do not submerge your ear in water and do not allow water into the canal until healed.  If your symptoms do not resolve or improve over the next 7 days, please follow-up with the ear nose and throat provider for further evaluation.   [facility] to schedule a primary care provider visit.  It looks like you have been on lisinopril in the past and today your blood pressure was elevated in clinic, similar to your reading in February.     ED Prescriptions     Medication Sig Dispense Auth. Provider   ofloxacin (FLOXIN) 0.3 % OTIC solution  (Status: Discontinued) Place 5 drops into the right ear 2 (two) times daily for 5 days. 10 mL Rinaldo RatelGarrison, [place] N, FNP   ofloxacin (FLOXIN) 0.3 % OTIC solution Place 5 drops into the right ear 2 (two) times daily for 7 days. 10 mL Saran Laviolette, [place] N, FNP       PDMP not reviewed this encounter.   Anyla Israelson, [place] N, [place] 12/15/22 1901

## 2022-12-15 NOTE — Discharge Instructions (Addendum)
You have an external ear infection of your right ear.  I suspect it did not fully resolve after your last ear infection.  Please take the ofloxacin drops, 5 drops twice daily for the next 7 days or 10 drops once daily for 7 days.  Please switch over your ear set to your left ear.  Do not insert Q-tips or attempt to clean your ear as this may worsen your infection.  Do not submerge your ear in water and do not allow water into the canal until healed.  If your symptoms do not resolve or improve over the next 7 days, please follow-up with the ear nose and throat provider for further evaluation.   Please contact [facility] to schedule a primary care provider visit.  It looks like you have been on lisinopril in the past and today your blood pressure was elevated in clinic, similar to your reading in February.

## 2023-01-30 ENCOUNTER — Observation Stay (HOSPITAL_COMMUNITY): Payer: Managed Care, Other (non HMO) | Admitting: Certified Registered Nurse Anesthetist

## 2023-01-30 ENCOUNTER — Encounter (HOSPITAL_COMMUNITY): Payer: Self-pay

## 2023-01-30 ENCOUNTER — Encounter (HOSPITAL_COMMUNITY)
Admission: EM | Disposition: A | Discharge status: Home / Self Care | Payer: Self-pay | Source: Home / Self Care | Attending: Internal Medicine

## 2023-01-30 ENCOUNTER — Encounter (HOSPITAL_COMMUNITY): Payer: Self-pay | Admitting: Emergency Medicine

## 2023-01-30 ENCOUNTER — Other Ambulatory Visit: Payer: Self-pay

## 2023-01-30 ENCOUNTER — Observation Stay (HOSPITAL_BASED_OUTPATIENT_CLINIC_OR_DEPARTMENT_OTHER): Payer: Managed Care, Other (non HMO) | Admitting: Certified Registered Nurse Anesthetist

## 2023-01-30 ENCOUNTER — Observation Stay (HOSPITAL_COMMUNITY): Payer: Managed Care, Other (non HMO)

## 2023-01-30 ENCOUNTER — Inpatient Hospital Stay (HOSPITAL_COMMUNITY)
Admission: EM | Admit: 2023-01-30 | Discharge: 2023-02-02 | DRG: 603 | Disposition: A | Discharge status: Home / Self Care | Payer: Managed Care, Other (non HMO) | Attending: Internal Medicine | Admitting: Internal Medicine

## 2023-01-30 ENCOUNTER — Ambulatory Visit (HOSPITAL_COMMUNITY)
Admission: EM | Admit: 2023-01-30 | Discharge: 2023-01-30 | Disposition: A | Discharge status: Home / Self Care | Payer: Managed Care, Other (non HMO)

## 2023-01-30 DIAGNOSIS — Z7984 Long term (current) use of oral hypoglycemic drugs: Secondary | ICD-10-CM | POA: Diagnosis not present

## 2023-01-30 DIAGNOSIS — E119 Type 2 diabetes mellitus without complications: Secondary | ICD-10-CM

## 2023-01-30 DIAGNOSIS — R7989 Other specified abnormal findings of blood chemistry: Secondary | ICD-10-CM | POA: Diagnosis present

## 2023-01-30 DIAGNOSIS — L0231 Cutaneous abscess of buttock: Secondary | ICD-10-CM

## 2023-01-30 DIAGNOSIS — R7401 Elevation of levels of liver transaminase levels: Secondary | ICD-10-CM | POA: Diagnosis present

## 2023-01-30 DIAGNOSIS — Z9049 Acquired absence of other specified parts of digestive tract: Secondary | ICD-10-CM

## 2023-01-30 DIAGNOSIS — I10 Essential (primary) hypertension: Secondary | ICD-10-CM | POA: Diagnosis not present

## 2023-01-30 DIAGNOSIS — E11628 Type 2 diabetes mellitus with other skin complications: Secondary | ICD-10-CM | POA: Diagnosis not present

## 2023-01-30 DIAGNOSIS — E871 Hypo-osmolality and hyponatremia: Secondary | ICD-10-CM | POA: Diagnosis present

## 2023-01-30 DIAGNOSIS — R000 Tachycardia, unspecified: Secondary | ICD-10-CM

## 2023-01-30 DIAGNOSIS — Z8249 Family history of ischemic heart disease and other diseases of the circulatory system: Secondary | ICD-10-CM

## 2023-01-30 DIAGNOSIS — E1165 Type 2 diabetes mellitus with hyperglycemia: Secondary | ICD-10-CM | POA: Diagnosis present

## 2023-01-30 DIAGNOSIS — D649 Anemia, unspecified: Secondary | ICD-10-CM | POA: Diagnosis present

## 2023-01-30 DIAGNOSIS — R739 Hyperglycemia, unspecified: Secondary | ICD-10-CM

## 2023-01-30 DIAGNOSIS — Z90710 Acquired absence of both cervix and uterus: Secondary | ICD-10-CM

## 2023-01-30 DIAGNOSIS — D1803 Hemangioma of intra-abdominal structures: Secondary | ICD-10-CM | POA: Diagnosis present

## 2023-01-30 HISTORY — DX: Essential (primary) hypertension: I10

## 2023-01-30 HISTORY — DX: Type 2 diabetes mellitus without complications: E11.9

## 2023-01-30 HISTORY — PX: IRRIGATION AND DEBRIDEMENT BUTTOCKS: SHX6601

## 2023-01-30 LAB — CBC WITH DIFFERENTIAL/PLATELET
Abs Immature Granulocytes: 0.05 10*3/uL (ref 0.00–0.07)
Basophils Absolute: 0.1 10*3/uL (ref 0.0–0.1)
Basophils Relative: 1 %
Eosinophils Absolute: 0 10*3/uL (ref 0.0–0.5)
Eosinophils Relative: 0 %
HCT: 40 % (ref 36.0–46.0)
Hemoglobin: 12.6 g/dL (ref 12.0–15.0)
Immature Granulocytes: 1 %
Lymphocytes Relative: 19 %
Lymphs Abs: 1.8 10*3/uL (ref 0.7–4.0)
MCH: 26.2 pg (ref 26.0–34.0)
MCHC: 31.5 g/dL (ref 30.0–36.0)
MCV: 83.2 fL (ref 80.0–100.0)
Monocytes Absolute: 0.7 10*3/uL (ref 0.1–1.0)
Monocytes Relative: 7 %
Neutro Abs: 6.6 10*3/uL (ref 1.7–7.7)
Neutrophils Relative %: 72 %
Platelets: 376 10*3/uL (ref 150–400)
RBC: 4.81 MIL/uL (ref 3.87–5.11)
RDW: 14.2 % (ref 11.5–15.5)
WBC: 9.2 10*3/uL (ref 4.0–10.5)
nRBC: 0 % (ref 0.0–0.2)

## 2023-01-30 LAB — HEPATITIS PANEL, ACUTE
HCV Ab: NONREACTIVE
Hep A IgM: NONREACTIVE
Hep B C IgM: NONREACTIVE
Hepatitis B Surface Ag: NONREACTIVE

## 2023-01-30 LAB — I-STAT VENOUS BLOOD GAS, ED
Acid-Base Excess: 4 mmol/L — ABNORMAL HIGH (ref 0.0–2.0)
Bicarbonate: 29.2 mmol/L — ABNORMAL HIGH (ref 20.0–28.0)
Calcium, Ion: 1.12 mmol/L — ABNORMAL LOW (ref 1.15–1.40)
HCT: 46 % (ref 36.0–46.0)
Hemoglobin: 15.6 g/dL — ABNORMAL HIGH (ref 12.0–15.0)
O2 Saturation: 48 %
Potassium: 4.2 mmol/L (ref 3.5–5.1)
Sodium: 130 mmol/L — ABNORMAL LOW (ref 135–145)
TCO2: 31 mmol/L (ref 22–32)
pCO2, Ven: 45.4 mmHg (ref 44–60)
pH, Ven: 7.416 (ref 7.25–7.43)
pO2, Ven: 26 mmHg — CL (ref 32–45)

## 2023-01-30 LAB — COMPREHENSIVE METABOLIC PANEL
ALT: 147 U/L — ABNORMAL HIGH (ref 0–44)
AST: 100 U/L — ABNORMAL HIGH (ref 15–41)
Albumin: 3.1 g/dL — ABNORMAL LOW (ref 3.5–5.0)
Alkaline Phosphatase: 578 U/L — ABNORMAL HIGH (ref 38–126)
Anion gap: 13 (ref 5–15)
BUN: 10 mg/dL (ref 6–20)
CO2: 25 mmol/L (ref 22–32)
Calcium: 8.9 mg/dL (ref 8.9–10.3)
Chloride: 89 mmol/L — ABNORMAL LOW (ref 98–111)
Creatinine, Ser: 0.92 mg/dL (ref 0.44–1.00)
GFR, Estimated: >60 mL/min (ref >60)
Glucose, Bld: 824 mg/dL (ref 70–99)
Potassium: 4.4 mmol/L (ref 3.5–5.1)
Sodium: 127 mmol/L — ABNORMAL LOW (ref 135–145)
Total Bilirubin: 1 mg/dL (ref 0.3–1.2)
Total Protein: 7.4 g/dL (ref 6.5–8.1)

## 2023-01-30 LAB — ACETAMINOPHEN LEVEL: Acetaminophen (Tylenol), Serum: <10 ug/mL — ABNORMAL LOW (ref 10–30)

## 2023-01-30 LAB — LACTIC ACID, PLASMA
Lactic Acid, Venous: 2.2 mmol/L (ref 0.5–1.9)
Lactic Acid, Venous: 1.8 mmol/L (ref 0.5–1.9)

## 2023-01-30 LAB — GLUCOSE, CAPILLARY
Glucose-Capillary: 221 mg/dL — ABNORMAL HIGH (ref 70–99)
Glucose-Capillary: 214 mg/dL — ABNORMAL HIGH (ref 70–99)
Glucose-Capillary: 198 mg/dL — ABNORMAL HIGH (ref 70–99)
Glucose-Capillary: 162 mg/dL — ABNORMAL HIGH (ref 70–99)
Glucose-Capillary: 172 mg/dL — ABNORMAL HIGH (ref 70–99)
Glucose-Capillary: 145 mg/dL — ABNORMAL HIGH (ref 70–99)

## 2023-01-30 LAB — HIV ANTIBODY (ROUTINE TESTING W REFLEX): HIV Screen 4th Generation wRfx: NONREACTIVE

## 2023-01-30 LAB — CBG MONITORING, ED
Glucose-Capillary: 521 mg/dL (ref 70–99)
Glucose-Capillary: 351 mg/dL — ABNORMAL HIGH (ref 70–99)
Glucose-Capillary: 416 mg/dL — ABNORMAL HIGH (ref 70–99)

## 2023-01-30 LAB — BETA-HYDROXYBUTYRIC ACID: Beta-Hydroxybutyric Acid: 0.48 mmol/L — ABNORMAL HIGH (ref 0.05–0.27)

## 2023-01-30 LAB — AEROBIC/ANAEROBIC CULTURE W GRAM STAIN (SURGICAL/DEEP WOUND)

## 2023-01-30 LAB — OSMOLALITY: Osmolality: 291 mOsm/kg (ref 275–295)

## 2023-01-30 SURGERY — IRRIGATION AND DEBRIDEMENT BUTTOCKS
Anesthesia: General | Site: Buttocks

## 2023-01-30 MED ORDER — ACETAMINOPHEN 10 MG/ML IV SOLN: 1000.0000 mg | Freq: Once | INTRAVENOUS | Status: DC | PRN

## 2023-01-30 MED ORDER — INSULIN REGULAR(HUMAN) IN NACL 100-0.9 UT/100ML-% IV SOLN
INTRAVENOUS | Status: DC
  Administered 2023-01-30: 4.6 [IU]/h via INTRAVENOUS
  Administered 2023-01-30: 15 [IU]/h via INTRAVENOUS
  Filled 2023-01-30: qty 100

## 2023-01-30 MED ORDER — FENTANYL CITRATE (PF) 250 MCG/5ML IJ SOLN
INTRAMUSCULAR | Status: AC
  Filled 2023-01-30: qty 5

## 2023-01-30 MED ORDER — INSULIN ASPART 100 UNIT/ML IJ SOLN
0.0000 [IU] | Freq: Every day | INTRAMUSCULAR | Status: DC
  Administered 2023-01-31 (×2): 3 [IU] via SUBCUTANEOUS

## 2023-01-30 MED ORDER — LISINOPRIL 20 MG PO TABS
20.0000 mg | ORAL_TABLET | Freq: Every day | ORAL | Status: DC
  Administered 2023-01-31 – 2023-02-02 (×3): 20 mg via ORAL
  Filled 2023-01-30 (×3): qty 1

## 2023-01-30 MED ORDER — ROCURONIUM BROMIDE 10 MG/ML (PF) SYRINGE
PREFILLED_SYRINGE | INTRAVENOUS | Status: DC | PRN
  Administered 2023-01-30: 40 mg via INTRAVENOUS

## 2023-01-30 MED ORDER — MIDAZOLAM HCL 2 MG/2ML IJ SOLN
INTRAMUSCULAR | Status: AC
  Filled 2023-01-30: qty 2

## 2023-01-30 MED ORDER — LACTATED RINGERS IV BOLUS
1000.0000 mL | Freq: Once | INTRAVENOUS | Status: AC
  Administered 2023-01-30: 1000 mL via INTRAVENOUS

## 2023-01-30 MED ORDER — DEXTROSE 50 % IV SOLN: 0.0000 mL | INTRAVENOUS | Status: DC | PRN

## 2023-01-30 MED ORDER — INSULIN ASPART 100 UNIT/ML IJ SOLN
0.0000 [IU] | Freq: Three times a day (TID) | INTRAMUSCULAR | Status: DC
  Administered 2023-01-31: 11 [IU] via SUBCUTANEOUS
  Administered 2023-01-31: 3 [IU] via SUBCUTANEOUS
  Administered 2023-01-31: 8 [IU] via SUBCUTANEOUS
  Administered 2023-02-01: 5 [IU] via SUBCUTANEOUS
  Administered 2023-02-01: 8 [IU] via SUBCUTANEOUS
  Administered 2023-02-01: 2 [IU] via SUBCUTANEOUS

## 2023-01-30 MED ORDER — LACTATED RINGERS IV SOLN: INTRAVENOUS | Status: DC

## 2023-01-30 MED ORDER — 0.9 % SODIUM CHLORIDE (POUR BTL) OPTIME
TOPICAL | Status: DC | PRN
  Administered 2023-01-30: 1000 mL

## 2023-01-30 MED ORDER — SODIUM CHLORIDE 0.9% FLUSH
3.0000 mL | Freq: Two times a day (BID) | INTRAVENOUS | Status: DC
  Administered 2023-01-30 – 2023-02-02 (×5): 3 mL via INTRAVENOUS

## 2023-01-30 MED ORDER — OXYCODONE HCL 5 MG PO TABS: 5.0000 mg | ORAL_TABLET | Freq: Once | ORAL | Status: DC | PRN

## 2023-01-30 MED ORDER — ACETAMINOPHEN 650 MG RE SUPP: 650.0000 mg | Freq: Four times a day (QID) | RECTAL | Status: DC | PRN

## 2023-01-30 MED ORDER — FENTANYL CITRATE (PF) 100 MCG/2ML IJ SOLN: 25.0000 ug | INTRAMUSCULAR | Status: DC | PRN

## 2023-01-30 MED ORDER — ONDANSETRON HCL 4 MG/2ML IJ SOLN
INTRAMUSCULAR | Status: DC | PRN
  Administered 2023-01-30: 4 mg via INTRAVENOUS

## 2023-01-30 MED ORDER — LACTATED RINGERS IV SOLN: INTRAVENOUS | Status: AC

## 2023-01-30 MED ORDER — CHLORHEXIDINE GLUCONATE 0.12 % MT SOLN
OROMUCOSAL | Status: AC
  Administered 2023-01-30: 15 mL via OROMUCOSAL
  Filled 2023-01-30: qty 15

## 2023-01-30 MED ORDER — AMISULPRIDE (ANTIEMETIC) 5 MG/2ML IV SOLN: 10.0000 mg | Freq: Once | INTRAVENOUS | Status: DC | PRN

## 2023-01-30 MED ORDER — KETOROLAC TROMETHAMINE 30 MG/ML IJ SOLN: 30.0000 mg | Freq: Once | INTRAMUSCULAR | Status: DC | PRN

## 2023-01-30 MED ORDER — PROPOFOL 10 MG/ML IV BOLUS
INTRAVENOUS | Status: DC | PRN
  Administered 2023-01-30: 200 mg via INTRAVENOUS

## 2023-01-30 MED ORDER — LACTATED RINGERS IV SOLN: INTRAVENOUS | Status: DC | PRN

## 2023-01-30 MED ORDER — VANCOMYCIN HCL IN DEXTROSE 1-5 GM/200ML-% IV SOLN
1000.0000 mg | Freq: Once | INTRAVENOUS | Status: AC
  Administered 2023-01-30: 1000 mg via INTRAVENOUS
  Filled 2023-01-30: qty 200

## 2023-01-30 MED ORDER — LIDOCAINE 2% (20 MG/ML) 5 ML SYRINGE
INTRAMUSCULAR | Status: DC | PRN
  Administered 2023-01-30: 60 mg via INTRAVENOUS

## 2023-01-30 MED ORDER — PROPOFOL 10 MG/ML IV BOLUS
INTRAVENOUS | Status: AC
  Filled 2023-01-30: qty 20

## 2023-01-30 MED ORDER — ORAL CARE MOUTH RINSE: 15.0000 mL | Freq: Once | OROMUCOSAL | Status: AC

## 2023-01-30 MED ORDER — SUGAMMADEX SODIUM 200 MG/2ML IV SOLN
INTRAVENOUS | Status: DC | PRN
  Administered 2023-01-30 (×2): 100 mg via INTRAVENOUS

## 2023-01-30 MED ORDER — CHLORHEXIDINE GLUCONATE 0.12 % MT SOLN: 15.0000 mL | Freq: Once | OROMUCOSAL | Status: AC

## 2023-01-30 MED ORDER — SODIUM CHLORIDE 0.9 % IV SOLN
2.0000 g | Freq: Three times a day (TID) | INTRAVENOUS | Status: DC
  Administered 2023-01-30 – 2023-02-01 (×6): 2 g via INTRAVENOUS
  Filled 2023-01-30 (×6): qty 12.5

## 2023-01-30 MED ORDER — OXYCODONE HCL 5 MG/5ML PO SOLN: 5.0000 mg | Freq: Once | ORAL | Status: DC | PRN

## 2023-01-30 MED ORDER — ACETAMINOPHEN 325 MG PO TABS
650.0000 mg | ORAL_TABLET | Freq: Four times a day (QID) | ORAL | Status: DC | PRN
  Administered 2023-02-01: 650 mg via ORAL
  Filled 2023-01-30: qty 2

## 2023-01-30 MED ORDER — MIDAZOLAM HCL 2 MG/2ML IJ SOLN
INTRAMUSCULAR | Status: DC | PRN
  Administered 2023-01-30: 2 mg via INTRAVENOUS

## 2023-01-30 MED ORDER — POTASSIUM CHLORIDE 10 MEQ/100ML IV SOLN
10.0000 meq | INTRAVENOUS | Status: AC
  Administered 2023-01-30 (×2): 10 meq via INTRAVENOUS
  Filled 2023-01-30 (×2): qty 100

## 2023-01-30 MED ORDER — DEXAMETHASONE SODIUM PHOSPHATE 10 MG/ML IJ SOLN
INTRAMUSCULAR | Status: DC | PRN
  Administered 2023-01-30: 4 mg via INTRAVENOUS

## 2023-01-30 MED ORDER — DEXTROSE IN LACTATED RINGERS 5 % IV SOLN: INTRAVENOUS | Status: DC

## 2023-01-30 MED ORDER — PROMETHAZINE HCL 25 MG/ML IJ SOLN: 6.2500 mg | INTRAMUSCULAR | Status: DC | PRN

## 2023-01-30 MED ORDER — INSULIN GLARGINE-YFGN 100 UNIT/ML ~~LOC~~ SOLN
40.0000 [IU] | Freq: Every day | SUBCUTANEOUS | Status: DC
  Administered 2023-01-30 – 2023-02-02 (×4): 40 [IU] via SUBCUTANEOUS
  Filled 2023-01-30 (×4): qty 0.4

## 2023-01-30 MED ORDER — DEXAMETHASONE SODIUM PHOSPHATE 10 MG/ML IJ SOLN
INTRAMUSCULAR | Status: AC
  Filled 2023-01-30: qty 1

## 2023-01-30 MED ORDER — FENTANYL CITRATE (PF) 250 MCG/5ML IJ SOLN
INTRAMUSCULAR | Status: DC | PRN
  Administered 2023-01-30: 150 ug via INTRAVENOUS

## 2023-01-30 MED ORDER — PHENYLEPHRINE 80 MCG/ML (10ML) SYRINGE FOR IV PUSH (FOR BLOOD PRESSURE SUPPORT)
PREFILLED_SYRINGE | INTRAVENOUS | Status: DC | PRN
  Administered 2023-01-30 (×2): 160 ug via INTRAVENOUS

## 2023-01-30 SURGICAL SUPPLY — 28 items
BAG COUNTER SPONGE SURGICOUNT (BAG) ×1 IMPLANT
BNDG GAUZE DERMACEA FLUFF 4 (GAUZE/BANDAGES/DRESSINGS) IMPLANT
COVER MAYO STAND STRL (DRAPES) ×1 IMPLANT
COVER SURGICAL LIGHT HANDLE (MISCELLANEOUS) ×1 IMPLANT
ELECT REM PT RETURN 9FT ADLT (ELECTROSURGICAL) ×1
ELECTRODE REM PT RTRN 9FT ADLT (ELECTROSURGICAL) ×1 IMPLANT
GAUZE PAD ABD 8X10 STRL (GAUZE/BANDAGES/DRESSINGS) IMPLANT
GAUZE SPONGE 4X4 12PLY STRL (GAUZE/BANDAGES/DRESSINGS) IMPLANT
GAUZE SPONGE 4X4 12PLY STRL LF (GAUZE/BANDAGES/DRESSINGS) IMPLANT
GLOVE BIOGEL PI IND STRL 7.0 (GLOVE) ×1 IMPLANT
GLOVE SURG SS PI 7.0 STRL IVOR (GLOVE) ×1 IMPLANT
GOWN STRL REUS W/ TWL LRG LVL3 (GOWN DISPOSABLE) ×2 IMPLANT
GOWN STRL REUS W/TWL LRG LVL3 (GOWN DISPOSABLE) ×2
KIT BASIN OR (CUSTOM PROCEDURE TRAY) ×1 IMPLANT
KIT TURNOVER KIT B (KITS) ×1 IMPLANT
NDL 22X1.5 STRL (OR ONLY) (MISCELLANEOUS) ×1 IMPLANT
NEEDLE 22X1.5 STRL (OR ONLY) (MISCELLANEOUS) ×1 IMPLANT
NS IRRIG 1000ML POUR BTL (IV SOLUTION) ×1 IMPLANT
PACK LITHOTOMY IV (CUSTOM PROCEDURE TRAY) ×1 IMPLANT
PAD ARMBOARD 7.5X6 YLW CONV (MISCELLANEOUS) ×1 IMPLANT
PENCIL SMOKE EVACUATOR (MISCELLANEOUS) ×1 IMPLANT
SPONGE T-LAP 18X18 ~~LOC~~+RFID (SPONGE) ×1 IMPLANT
SURGILUBE 2OZ TUBE FLIPTOP (MISCELLANEOUS) ×1 IMPLANT
SYR BULB EAR ULCER 3OZ GRN STR (SYRINGE) ×1 IMPLANT
TOWEL GREEN STERILE (TOWEL DISPOSABLE) ×1 IMPLANT
TOWEL GREEN STERILE FF (TOWEL DISPOSABLE) ×1 IMPLANT
TUBE CONNECTING 12X1/4 (SUCTIONS) ×1 IMPLANT
YANKAUER SUCT BULB TIP NO VENT (SUCTIONS) ×1 IMPLANT

## 2023-01-30 NOTE — Transfer of Care (Signed)
Immediate Anesthesia Transfer of Care Note  Patient: Lisa Cross  Procedure(s) Performed: IRRIGATION AND DEBRIDEMENT BUTTOCKS (Buttocks)  Patient Location: PACU  Anesthesia Type:General  Level of Consciousness: drowsy and patient cooperative  Airway & Oxygen Therapy: Patient Spontanous Breathing  Post-op Assessment: Report given to RN and Post -op Vital signs reviewed and stable  Post vital signs: Reviewed and stable  Last Vitals:  Vitals Value Taken Time  BP 175/98 01/30/23 1745  Temp    Pulse 89 01/30/23 1751  Resp 0 01/30/23 1751  SpO2 96 % 01/30/23 1751  Vitals shown include unvalidated device data.  Last Pain:  Vitals:   01/30/23 1602  TempSrc: (P) Oral  PainSc:          Complications: No notable events documented.

## 2023-01-30 NOTE — Anesthesia Procedure Notes (Signed)
Procedure Name: Intubation Date/Time: 01/30/2023 4:48 PM  Performed by: Audie Pinto, CRNAPre-anesthesia Checklist: Patient identified, Emergency Drugs available, Suction available and Patient being monitored Patient Re-evaluated:Patient Re-evaluated prior to induction Oxygen Delivery Method: Circle system utilized Preoxygenation: Pre-oxygenation with 100% oxygen Induction Type: IV induction Ventilation: Mask ventilation without difficulty Laryngoscope Size: Mac and 4 Grade View: Grade I Tube type: Oral Tube size: 7.0 mm Number of attempts: 1 Airway Equipment and Method: Stylet and Oral airway Placement Confirmation: ETT inserted through vocal cords under direct vision, positive ETCO2 and breath sounds checked- equal and bilateral Secured at: 21 cm Tube secured with: Tape Dental Injury: Teeth and Oropharynx as per pre-operative assessment

## 2023-01-30 NOTE — ED Notes (Signed)
Patient is being discharged from the Urgent Care and sent to the Emergency Department via POV. Per Rennis Chris, NP, patient is in need of higher level of care due to infection. Patient is aware and verbalizes understanding of plan of care.  Vitals:   01/30/23 1015  BP: (!) 156/98  Pulse: (!) 117  Resp: 17  Temp: 98.6 F (37 C)  SpO2: 95%

## 2023-01-30 NOTE — ED Triage Notes (Signed)
Pt states she has abscess on her buttocks x 1 week+ . Pt sent from UC for further eval and concern for infection.

## 2023-01-30 NOTE — Op Note (Signed)
Preoperative diagnosis: buttock abscess  Postoperative diagnosis: same   Procedure: incision and drainage of buttock abscess  Surgeon: Feliciana Rossetti, M.D.  Asst: none  Anesthesia: GETA  Indications for procedure: Zakariah Parnes is a 52 y.o. year old female with symptoms of hyperglycemia and buttock abscess with surrounding pustules. After discussion of treatment plan, she was brought to the operating room.  Description of procedure: The patient was brought into the operative suite. Anesthesia was administered with General endotracheal anesthesia. WHO checklist was applied. The patient was then placed in prone position. The area was prepped and draped in the usual sterile fashion.  Next, an incision was made in the large fluctuant area. Moderate purulence was drained but the purulence was mainly coming from the dermal layer and not from the subcutaneous layer. The majority of subcutaneous induration was firm and not liquid. There was some tracking deeper with a thinner liquid. A counter incision was made inferiorly and a 1/4" penrose was brought through, again this area had dermal pus drainage. The penrose was sutured to itself with 2-0 silk. Bandage was put in place.  Findings: dermal purulence  Specimen: abscess of buttock  Implant: 1/4" penrose   Blood loss: 10 ml  Local anesthesia: none  Complications: none  Feliciana Rossetti, M.D. General, Bariatric, & Minimally Invasive Surgery [facility]

## 2023-01-30 NOTE — Progress Notes (Signed)
Pharmacy Antibiotic Note  Lisa Cross is a 52 y.o. female for which pharmacy has been consulted for cefepime dosing for  wound infection . Patient presenting with 2 weeks of swelling and pain in the upper buttock.  SCr 0.92 WBC 9.2; LA 1.8; T 98; HR 120; RR 18  Plan: Vancomycin 1g given in the ED Cefepime 2g q8hr  Trend WBC, Fever, Renal function F/u cultures, clinical course, WBC De-escalate when able  Height: 5\' 5"  (165.1 cm) Weight: 74.8 kg (165 lb) IBW/kg (Calculated) : 57  Temp (24hrs), Avg:98.3 F (36.8 C), Min:98 F (36.7 C), Max:98.6 F (37 C)  Recent Labs  Lab 01/30/23 1114  WBC 9.2  CREATININE 0.92  LATICACIDVEN 2.2*    Estimated Creatinine Clearance: 73.2 mL/min (by C-G formula based on SCr of 0.92 mg/dL).    No Known Allergies  Microbiology results: Pending  Thank you for allowing pharmacy to be a part of this patient's care.  Delmar Landau, PharmD, BCPS 01/30/2023 1:10 PM ED Clinical Pharmacist -  [phone]

## 2023-01-30 NOTE — ED Provider Notes (Addendum)
[facility]    CSN: [id number] Arrival date & time: 01/30/23  1001      History   Chief Complaint Chief Complaint  Patient presents with   Abscess    HPI Lisa Cross is a 52 y.o. female.   Patient presents to urgent care for evaluation of possible abscess to the left upper buttocks that started 2 weeks ago. Abscess has grown significantly in tenderness, redness, warmth, and size over the last 2 weeks. She states the area has started to drain "a [place] bit". Drainage is "greenish/yellowish". She has had this in the past but states it's been over a year since she has experienced abscess to this area. She is a type 2 diabetic and is not currently taking her metformin.  She states she missed an appointment with her primary care provider greater than 3 to 4 months ago and requested a refill of her metformin but was unable to obtain this since her primary care provider refused to send it if she did not come to the office for evaluation.  She has a history of noncompliance with medications.  She is not currently experiencing any symptoms of hyper/hypoglycemia.  Denies recent antibiotic/steroid use.  No recent fevers, chills, nausea, vomiting, abdominal pain, dizziness, or lightheadedness.   Abscess   Past Medical History:  Diagnosis Date   Anemia    Diabetes mellitus without complication (HCC)    Hypertension     Patient Active Problem List   Diagnosis Date Noted   Noncompliance by refusing intervention or support 04/28/2019   Diabetes mellitus (HCC) 12/01/2018   Noncompliance with medications 10/30/2018   Essential hypertension 02/06/2016   Hyperglycemia 02/06/2016   Uncontrolled hypertension 08/28/2015   Elevated blood sugar 08/28/2015    Past Surgical History:  Procedure Laterality Date   ABDOMINAL HYSTERECTOMY     BRAIN SURGERY     CHOLECYSTECTOMY      OB History   No obstetric history on file.      Home Medications    Prior to Admission medications    Medication Sig Start Date End Date Taking? Authorizing Provider  lisinopril (ZESTRIL) 20 MG tablet Take by mouth. 12/01/18   [provider]  metFORMIN (GLUCOPHAGE) 500 MG tablet Take 1 tablet (500 mg total) by mouth daily with breakfast. 05/30/17   Jeanie Sewer, PA-C    Family History Family History  Problem Relation Age of Onset   Hypertension Mother    Hypertension Father    Dementia Maternal Grandfather    Hypertension Paternal Grandmother    Hypertension Paternal Grandfather     Social History Social History   Tobacco Use   Smoking status: Never   Smokeless tobacco: Never  Substance Use Topics   Alcohol use: No    Alcohol/week: 0.0 standard drinks of alcohol   Drug use: No     Allergies   Patient has no known allergies.   Review of Systems Review of Systems Per HPI  Physical Exam Triage Vital Signs ED Triage Vitals  Enc Vitals Group     BP 01/30/23 1015 (!) 156/98     Pulse Rate 01/30/23 1015 (!) 117     Resp 01/30/23 1015 17     Temp 01/30/23 1015 98.6 F (37 C)     Temp Source 01/30/23 1015 Oral     SpO2 01/30/23 1015 95 %     Weight --      Height --      Head Circumference --  Peak Flow --      Pain Score 01/30/23 1014 6     Pain Loc --      Pain Edu? --      Excl. in GC? --    No data found.  Updated Vital Signs BP (!) 156/98 (BP Location: Right Arm)   Pulse (!) 117   Temp 98.6 F (37 C) (Oral)   Resp 17   SpO2 95%   Visual Acuity Right Eye Distance:   Left Eye Distance:   Bilateral Distance:    Right Eye Near:   Left Eye Near:    Bilateral Near:     Physical Exam Vitals and nursing note reviewed.  Constitutional:      Appearance: She is not ill-appearing or toxic-appearing.  HENT:     Head: Normocephalic and atraumatic.     Right Ear: Hearing and external ear normal.     Left Ear: Hearing and external ear normal.     Nose: Nose normal.     Mouth/Throat:     Lips: Pink.  Eyes:     General: Lids are normal.  Vision grossly intact. Gaze aligned appropriately.     Extraocular Movements: Extraocular movements intact.     Conjunctiva/sclera: Conjunctivae normal.  Cardiovascular:     Rate and Rhythm: Regular rhythm. Tachycardia present.     Heart sounds: Normal heart sounds, S1 normal and S2 normal.  Pulmonary:     Effort: Pulmonary effort is normal. No respiratory distress.     Breath sounds: Normal breath sounds and air entry.  Genitourinary:    Comments: Multiple pockets of induration to the left buttocks with diffuse skin breakdown to the bilateral buttocks.  There is a pilonidal cyst with infected abscess to the left upper gluteal cleft with evidence of ulceration and purulent/yellow drainage.  Area is very tender to palpation.  There are also multiple pustules to the bilateral buttocks. Musculoskeletal:     Cervical back: Neck supple.  Skin:    General: Skin is warm and dry.     Capillary Refill: Capillary refill takes less than 2 seconds.     Findings: No rash.  Neurological:     General: No focal deficit present.     Mental Status: She is alert and oriented to person, place, and time. Mental status is at baseline.     Cranial Nerves: No dysarthria or facial asymmetry.  Psychiatric:        Mood and Affect: Mood normal.        Speech: Speech normal.        Behavior: Behavior normal.        Thought Content: Thought content normal.        Judgment: Judgment normal.      UC Treatments / Results  Labs (all labs ordered are listed, but only abnormal results are displayed) Labs Reviewed - No data to display  EKG   Radiology No results found.  Procedures Procedures (including critical care time)  Medications Ordered in UC Medications - No data to display  Initial Impression / Assessment and Plan / UC Course  I have reviewed the triage vital signs and the nursing notes.  Pertinent labs & imaging results that were available during my care of the patient were reviewed by me and  considered in my medical decision making (see chart for details).   1.  Abscess of right buttock, type 2 diabetes with other skin complication without long-term use of insulin, tachycardia Patient with history  of type 2 diabetes and medication noncompliance presenting to urgent care with evidence of significant infected pilonidal abscess with overlying ulceration of the skin.  There are also multiple areas and pockets of induration to the left buttock surrounded by skin breakdown.  Infection has been worsening over the last 2 weeks and patient has become tachycardic, however afebrile.  She does not currently meet SIRS criteria, however given severity of abscess with history of poorly controlled type 2 diabetes and immunosuppression, recommend patient report to the nearest emergency department for further workup and evaluation.  She is agreeable with plan.  Discussed risks of deferring ED visit to which she further expresses understanding and agreement with plan.  She is stable to go by personal vehicle to the ED.   Final Clinical Impressions(s) / UC Diagnoses   Final diagnoses:  Abscess of buttock, right  Type 2 diabetes mellitus with other skin complication, without long-term current use of insulin (HCC)  Tachycardia     Discharge Instructions      Your abscess is significant to the buttocks and I am concerned that you may have a developing severe infection, therefore I would like for you to go to the nearest emergency room for this to be evaluated further.      ED Prescriptions   None    PDMP not reviewed this encounter.   Carlisle Beers, FNP 01/30/23 1051    Reita May [place], [place] 01/30/23 1052

## 2023-01-30 NOTE — H&P (Signed)
History and Physical   Aleksis Thrun ZOX:[id number] DOB: Dec 08, 1970 DOA: 01/30/2023  PCP: Carmel [place], NP   Patient coming from: Home  Chief Complaint: Left buttock abscess  HPI: Lisa Cross is a 52 y.o. female with medical history significant of hypertension, diabetes, anemia presenting with left buttock abscess.  Patient reports she has had a left buttock abscess for about 2 weeks.  Has not noticed significant drainage but has had some yellow discharge.  She states she has had prior abscesses in the past but has never needed to have them drained.  She initially presented to urgent care but was sent to the ED for further evaluation.  She denies fevers, chills, chest pain, shortness of breath, abdominal pain, constipation, diarrhea, nausea, vomiting.  Does report some tingling in her feet starting today.  Of note, did run out of metformin recently.  Blood sugars running high.  ED Course: Vital signs in the ED notable for heart rate in the 110s to 120s, blood pressure in the 150s to 160s systolic.  Lab workup included CMP with sodium 127 corrects considering glucose 824, chloride 89, albumin 3.1, AST 100, ALT 147, alk phos 578.  Normal T. bili.  Lactic acid 2.2 with repeat pending.  CBC within normal limits.  Urinalysis pending, blood cultures pending.  Beta hydroxybutyric acid pending.  VBG with normal pH and normal pCO2.  No imaging in the ED.  General surgery consulted and plan to take the patient for surgical intervention and do not see indication for previously ordered CT at this time.  Patient received vancomycin, cefepime, 20 mill equivalents IV potassium, insulin drip, liter fluids followed by maintenance fluids in the ED.  Review of Systems: As per HPI otherwise all other systems reviewed and are negative.  Past Medical History:  Diagnosis Date   Anemia    Diabetes mellitus without complication (HCC)    Hypertension    Noncompliance with medications 10/30/2018   Formatting of this  note might be different from the original. Patient is very resistant to taking medications she "believes they are poison", discussed benefits of treatment and discussed risks of non treatment    Past Surgical History:  Procedure Laterality Date   ABDOMINAL HYSTERECTOMY     BRAIN SURGERY     CHOLECYSTECTOMY      Social History  reports that she has never smoked. She has never used smokeless tobacco. She reports that she does not drink alcohol and does not use drugs.  No Known Allergies  Family History  Problem Relation Age of Onset   Hypertension Mother    Hypertension Father    Dementia Maternal Grandfather    Hypertension Paternal Grandmother    Hypertension Paternal Grandfather   Reviewed on admission  Prior to Admission medications   Medication Sig Start Date End Date Taking? Authorizing Provider  lisinopril (ZESTRIL) 20 MG tablet Take by mouth. 12/01/18   [provider]  metFORMIN (GLUCOPHAGE) 500 MG tablet Take 1 tablet (500 mg total) by mouth daily with breakfast. 05/30/17   Jeanie Sewer, PA-C    Physical Exam: Vitals:   01/30/23 1107 01/30/23 1107  BP:  (!) 162/93  Pulse:  (!) 120  Resp:  18  Temp:  98 F (36.7 C)  SpO2:  100%  Weight: 74.8 kg   Height: 5\' 5"  (1.651 m)     Physical Exam Constitutional:      General: She is not in acute distress.    Appearance: Normal appearance.  HENT:  Head: Normocephalic and atraumatic.     Mouth/Throat:     Mouth: Mucous membranes are moist.     Pharynx: Oropharynx is clear.  Eyes:     Extraocular Movements: Extraocular movements intact.     Pupils: Pupils are equal, round, and reactive to light.  Cardiovascular:     Rate and Rhythm: Regular rhythm. Tachycardia present.     Pulses: Normal pulses.     Heart sounds: Normal heart sounds.  Pulmonary:     Effort: Pulmonary effort is normal. No respiratory distress.     Breath sounds: Normal breath sounds.  Abdominal:     General: Bowel sounds are normal.  There is no distension.     Palpations: Abdomen is soft.     Tenderness: There is no abdominal tenderness.  Musculoskeletal:        General: No swelling or deformity.  Skin:    General: Skin is warm and dry.  Neurological:     General: No focal deficit present.     Mental Status: Mental status is at baseline.    Labs on Admission: I have personally reviewed following labs and imaging studies  CBC: Recent Labs  Lab 01/30/23 1114 01/30/23 1321  WBC 9.2  --   NEUTROABS 6.6  --   HGB 12.6 15.6*  HCT 40.0 46.0  MCV 83.2  --   PLT 376  --     Basic Metabolic Panel: Recent Labs  Lab 01/30/23 1114 01/30/23 1321  NA 127* 130*  K 4.4 4.2  CL 89*  --   CO2 25  --   GLUCOSE 824*  --   BUN 10  --   CREATININE 0.92  --   CALCIUM 8.9  --     GFR: Estimated Creatinine Clearance: 73.2 mL/min (by C-G formula based on SCr of 0.92 mg/dL).  Liver Function Tests: Recent Labs  Lab 01/30/23 1114  AST 100*  ALT 147*  ALKPHOS 578*  BILITOT 1.0  PROT 7.4  ALBUMIN 3.1*    Urine analysis:    Component Value Date/Time   BILIRUBINUR neg 12/03/2012 0929   PROTEINUR neg 12/03/2012 0929   UROBILINOGEN 0.2 12/03/2012 0929   NITRITE neg 12/03/2012 0929   LEUKOCYTESUR Negative 12/03/2012 0929    Radiological Exams on Admission: No results found.  EKG: Ordered in ED but not yet performed.  Assessment/Plan Principal Problem:   Left buttock abscess Active Problems:   Essential hypertension   Diabetes mellitus (HCC)   Hyperglycemia due to diabetes mellitus (HCC)   Left buttock abscess > Presenting for ongoing left buttock abscess for the past couple weeks.  Some discharge but not significant drainage. > History of previous abscesses but never requiring drainage. > Seen by general surgery in the ED and plan for OR I&D today.  CT had been ordered but with plan for I&D General surgery had this canceled. > Received vancomycin and cefepime in the ED - Monitor on telemetry  overnight - Appreciate general surgery's recommendations and assistance - Continue with antibiotics for now - Trend fever curve and WBC - Follow-up urinalysis and blood cultures  Hyperglycemia Rule out HHS Diabetes > Patient out of metformin and currently with worsening left buttock abscess. > BMP with glucose of 824, repeat CBG 521. Monitor on progressive unit Continue with insulin drip > Started on insulin drip in the ED.  Also received a liter of IV fluids, 20 mill equivalents IV potassium and started on a rate of IV fluids. - Continue insulin  gtt - Will transition to SSI once CBG consistently well-controlled - Follow-up serum osmolality - Continue IVF  LFT elevation > AST 100, ALT 147, alk phos 578.  Incidental, no abdominal pain. - Abdominal ultrasound - Check acute hepatitis panel - Follow-up Tylenol panel ordered by EDP  Hypertension - Continue home lisinopril  DVT prophylaxis: SCDs Code Status:   Full Family Communication:  None on admission  Disposition Plan:   Patient is from:  Home  Anticipated DC to:  Home  Anticipated DC date:  1 to 2 days  Anticipated DC barriers: None  Consults called:  General surgery Admission status:  Observation, progressive  Severity of Illness: The appropriate patient status for this patient is OBSERVATION. Observation status is judged to be reasonable and necessary in order to provide the required intensity of service to ensure the patient's safety. The patient's presenting symptoms, physical exam findings, and initial radiographic and laboratory data in the context of their medical condition is felt to place them at decreased risk for further clinical deterioration. Furthermore, it is anticipated that the patient will be medically stable for discharge from the hospital within 2 midnights of admission.    Synetta Fail MD [facility]  How to contact the [facility] Attending or Consulting provider 7A - 7P or covering provider  during after hours 7P -7A, for this patient?   Check the care team in [facility] and look for a) attending/consulting [facility] provider listed and b) the [facility] team listed Log into [email or web address] and use [place]'s universal password to access. If you do not have the password, please contact the hospital operator. Locate the [facility] provider you are looking for under [facility] and page to a number that you can be directly reached. If you still have difficulty reaching the provider, please page the [facility] (Director on Call) for the Hospitalists listed on amion for assistance.  01/30/2023, 2:35 PM

## 2023-01-30 NOTE — Progress Notes (Signed)
New patient admitted to room 5 from pacu after I and d  of left buttocks. Mp shows nsr to stach. Patientis a/o x4. Patient is on a insulin drip. Patient has a purwik on. No family with patient.

## 2023-01-30 NOTE — Anesthesia Preprocedure Evaluation (Addendum)
Anesthesia Evaluation  Patient identified by MRN, date of birth, ID band Patient awake    Reviewed: Allergy & Precautions, NPO status , Patient's Chart, lab work & pertinent test results  Airway Mallampati: III  TM Distance: >3 FB Neck ROM: Full    Dental no notable dental hx.    Pulmonary neg pulmonary ROS   Pulmonary exam normal        Cardiovascular hypertension, Pt. on medications Normal cardiovascular exam     Neuro/Psych negative neurological ROS  negative psych ROS   GI/Hepatic negative GI ROS, Neg liver ROS,,,  Endo/Other  diabetes, Oral Hypoglycemic Agents    Renal/GU negative Renal ROS     Musculoskeletal negative musculoskeletal ROS (+)    Abdominal   Peds  Hematology negative hematology ROS (+)   Anesthesia Other Findings ABSCESS  Reproductive/Obstetrics                             Anesthesia Physical Anesthesia Plan  ASA: 3 and emergent  Anesthesia Plan: General   Post-op Pain Management:    Induction: Intravenous  PONV Risk Score and Plan: 3 and Ondansetron, Dexamethasone, Midazolam and Treatment may vary due to age or medical condition  Airway Management Planned: Oral ETT  Additional Equipment:   Intra-op Plan:   Post-operative Plan: Extubation in OR  Informed Consent: I have reviewed the patients History and Physical, chart, labs and discussed the procedure including the risks, benefits and alternatives for the proposed anesthesia with the patient or authorized representative who has indicated his/her understanding and acceptance.     Dental advisory given  Plan Discussed with: CRNA  Anesthesia Plan Comments:        Anesthesia Quick Evaluation

## 2023-01-30 NOTE — Consult Note (Signed)
Reason for Consult:buttock abscess Referring Provider: Glynn Octave  Lisa Cross is an 52 y.o. female.  HPI: 52 yo female with 2 weeks of swelling and pain in the upper buttock. She has a history of abscesses in multiple places. She takes metformin for diabetes and her glc are often high. She denies fevers.  Past Medical History:  Diagnosis Date   Anemia    Diabetes mellitus without complication (HCC)    Hypertension    Noncompliance with medications 10/30/2018   Formatting of this note might be different from the original. Patient is very resistant to taking medications she "believes they are poison", discussed benefits of treatment and discussed risks of non treatment    Past Surgical History:  Procedure Laterality Date   ABDOMINAL HYSTERECTOMY     BRAIN SURGERY     CHOLECYSTECTOMY      Family History  Problem Relation Age of Onset   Hypertension Mother    Hypertension Father    Dementia Maternal Grandfather    Hypertension Paternal Grandmother    Hypertension Paternal Grandfather     Social History:  reports that she has never smoked. She has never used smokeless tobacco. She reports that she does not drink alcohol and does not use drugs.  Allergies: No Known Allergies  Medications: I have reviewed the patient's current medications.  Results for orders placed or performed during the hospital encounter of 01/30/23 (from the past 48 hour(s))  Lactic acid, plasma     Status: Abnormal   Collection Time: 01/30/23 11:14 AM  Result Value Ref Range   Lactic Acid, Venous 2.2 (HH) 0.5 - 1.9 mmol/L    Comment: CRITICAL RESULT CALLED TO, READ BACK BY AND VERIFIED WITH M,MCIVER RN @1243  01/30/23 E,BENTON Performed at [facility], [address] [place], [place] [address]   Comprehensive metabolic panel     Status: Abnormal   Collection Time: 01/30/23 11:14 AM  Result Value Ref Range   Sodium 127 (L) 135 - 145 mmol/L   Potassium 4.4 3.5 - 5.1 mmol/L   Chloride 89  (L) 98 - 111 mmol/L   CO2 25 22 - 32 mmol/L   Glucose, Bld 824 (HH) 70 - 99 mg/dL    Comment: CRITICAL RESULT CALLED TO, READ BACK BY AND VERIFIED WITH M,MCIVER RN @1244  01/30/23 E,BENTON Glucose reference range applies only to samples taken after fasting for at least 8 hours.    BUN 10 6 - 20 mg/dL   Creatinine, Ser 8.11 0.44 - 1.00 mg/dL   Calcium 8.9 8.9 - 91.4 mg/dL   Total Protein 7.4 6.5 - 8.1 g/dL   Albumin 3.1 (L) 3.5 - 5.0 g/dL   AST 782 (H) 15 - 41 U/L   ALT 147 (H) 0 - 44 U/L   Alkaline Phosphatase 578 (H) 38 - 126 U/L   Total Bilirubin 1.0 0.3 - 1.2 mg/dL   GFR, Estimated >95 >62 mL/min    Comment: (NOTE) Calculated using the CKD-EPI Creatinine Equation (2021)    Anion gap 13 5 - 15    Comment: Performed at [facility], [address] [place], [place] [address]  CBC with Differential     Status: None   Collection Time: 01/30/23 11:14 AM  Result Value Ref Range   WBC 9.2 4.0 - 10.5 K/uL   RBC 4.81 3.87 - 5.11 MIL/uL   Hemoglobin 12.6 12.0 - 15.0 g/dL   HCT 57.8 46.9 - 62.9 %   MCV 83.2 80.0 -  100.0 fL   MCH 26.2 26.0 - 34.0 pg   MCHC 31.5 30.0 - 36.0 g/dL   RDW 16.1 09.6 - 04.5 %   Platelets 376 150 - 400 K/uL   nRBC 0.0 0.0 - 0.2 %   Neutrophils Relative % 72 %   Neutro Abs 6.6 1.7 - 7.7 K/uL   Lymphocytes Relative 19 %   Lymphs Abs 1.8 0.7 - 4.0 K/uL   Monocytes Relative 7 %   Monocytes Absolute 0.7 0.1 - 1.0 K/uL   Eosinophils Relative 0 %   Eosinophils Absolute 0.0 0.0 - 0.5 K/uL   Basophils Relative 1 %   Basophils Absolute 0.1 0.0 - 0.1 K/uL   Immature Granulocytes 1 %   Abs Immature Granulocytes 0.05 0.00 - 0.07 K/uL    Comment: Performed at [facility], [address] [place], [place] [address]  Beta-hydroxybutyric acid     Status: Abnormal   Collection Time: 01/30/23 12:49 PM  Result Value Ref Range   Beta-Hydroxybutyric Acid 0.48 (H) 0.05 - 0.27 mmol/L    Comment: Performed at [facility], [address], [place],  [place] [address]  Lactic acid, plasma     Status: None   Collection Time: 01/30/23  1:11 PM  Result Value Ref Range   Lactic Acid, Venous 1.8 0.5 - 1.9 mmol/L    Comment: Performed at [facility], [address] [place], [place] [address]  I-Stat venous blood gas, [facility] ED, [facility], [facility])     Status: Abnormal   Collection Time: 01/30/23  1:21 PM  Result Value Ref Range   pH, Ven 7.416 7.25 - 7.43   pCO2, Ven 45.4 44 - 60 mmHg   pO2, Ven 26 (LL) 32 - 45 mmHg   Bicarbonate 29.2 (H) 20.0 - 28.0 mmol/L   TCO2 31 22 - 32 mmol/L   O2 Saturation 48 %   Acid-Base Excess 4.0 (H) 0.0 - 2.0 mmol/L   Sodium 130 (L) 135 - 145 mmol/L   Potassium 4.2 3.5 - 5.1 mmol/L   Calcium, Ion 1.12 (L) 1.15 - 1.40 mmol/L   HCT 46.0 36.0 - 46.0 %   Hemoglobin 15.6 (H) 12.0 - 15.0 g/dL   Sample type VENOUS    Comment NOTIFIED PHYSICIAN   CBG monitoring, ED     Status: Abnormal   Collection Time: 01/30/23  1:35 PM  Result Value Ref Range   Glucose-Capillary 521 (HH) 70 - 99 mg/dL    Comment: Glucose reference range applies only to samples taken after fasting for at least 8 hours.   Comment 1 Notify RN    Comment 2 Document in Chart   CBG monitoring, ED     Status: Abnormal   Collection Time: 01/30/23  2:43 PM  Result Value Ref Range   Glucose-Capillary 416 (H) 70 - 99 mg/dL    Comment: Glucose reference range applies only to samples taken after fasting for at least 8 hours.    No results found.  ROS  PE Blood pressure (!) 162/93, pulse (!) 120, temperature 98 F (36.7 C), resp. rate 18, height 5\' 5"  (1.651 m), weight 74.8 kg, SpO2 100 %. Constitutional: NAD; conversant; no deformities Eyes: Moist conjunctiva; no lid lag; anicteric; PERRL Neck: Trachea midline; no thyromegaly Lungs: Normal respiratory effort; no tactile fremitus CV: RRR; no palpable thrills; no pitting edema GI: Abd soft; no palpable hepatosplenomegaly MSK: Normal gait; no clubbing/cyanosis Back: gluteal cleft with large indurated area  consistent  with abscess, multiple small pustules over the upper left buttock as well Psychiatric: Appropriate affect; alert and oriented x3 Lymphatic: No palpable cervical or axillary lymphadenopathy Skin: No major subcutaneous nodules. Warm and dry   Assessment/Plan: 52 yo female with uncontrolled diabetes presents with abscess in the upper buttock region. -IV abx -glucose control -I+D today -admit to medicine for DM management -no need for imaging for this abscess.  I reviewed last 24 h vitals and pain scores, last 48 h intake and output, last 24 h labs and trends, and last 24 h imaging results.  This care required high  level of medical decision making.   De Blanch Daily Crate 01/30/2023, 2:50 PM

## 2023-01-30 NOTE — ED Triage Notes (Signed)
Pt has boils one on spine area and couple on left buttock area. The large one is draining per patient.

## 2023-01-30 NOTE — ED Notes (Signed)
ED TO INPATIENT HANDOFF REPORT  ED Nurse Name and Phone #: [phone]  S Name/Age/Gender Lisa Cross 52 y.o. female Room/Bed: 006C/006C  Code Status   Code Status: Full Code  Home/SNF/Other Home Patient oriented to: self, place, time, and situation Is this baseline? Yes   Triage Complete: Triage complete  Chief Complaint Left buttock abscess [L02.31]  Triage Note Pt states she has abscess on her buttocks x 1 week+ . Pt sent from UC for further eval and concern for infection.   Allergies No Known Allergies  Level of Care/Admitting Diagnosis ED Disposition     ED Disposition  Admit   Condition  --   Comment  Hospital Area: [facility] [[id number]]  Level of Care: Progressive [102]  Admit to Progressive based on following criteria: GI, ENDOCRINE disease patients with GI bleeding, acute liver failure or pancreatitis, stable with diabetic ketoacidosis or thyrotoxicosis (hypothyroid) state.  May place patient in observation at [facility] or [facility] if equivalent level of care is available:: No  Covid Evaluation: Asymptomatic - no recent exposure (last 10 days) testing not required  Diagnosis: Left buttock abscess [697710]  Admitting Physician: Synetta Fail [[id number]]  Attending Physician: Synetta Fail [[id number]]          B Medical/Surgery History Past Medical History:  Diagnosis Date   Anemia    Diabetes mellitus without complication (HCC)    Hypertension    Noncompliance with medications 10/30/2018   Formatting of this note might be different from the original. Patient is very resistant to taking medications she "believes they are poison", discussed benefits of treatment and discussed risks of non treatment   Past Surgical History:  Procedure Laterality Date   ABDOMINAL HYSTERECTOMY     BRAIN SURGERY     CHOLECYSTECTOMY       A IV Location/Drains/Wounds Patient Lines/Drains/Airways Status     Active Line/Drains/Airways      Name Placement date Placement time Site Days   Peripheral IV 01/30/23 20 G Left Antecubital 01/30/23  1340  Antecubital  less than 1   Peripheral IV 01/30/23 20 G Right Antecubital 01/30/23  1330  Antecubital  less than 1            Intake/Output Last 24 hours No intake or output data in the 24 hours ending 01/30/23 1457  Labs/Imaging Results for orders placed or performed during the hospital encounter of 01/30/23 (from the past 48 hour(s))  Lactic acid, plasma     Status: Abnormal   Collection Time: 01/30/23 11:14 AM  Result Value Ref Range   Lactic Acid, Venous 2.2 (HH) 0.5 - 1.9 mmol/L    Comment: CRITICAL RESULT CALLED TO, READ BACK BY AND VERIFIED WITH M,MCIVER RN @1243  01/30/23 E,BENTON Performed at [facility], [address], [place], [place] [address]   Comprehensive metabolic panel     Status: Abnormal   Collection Time: 01/30/23 11:14 AM  Result Value Ref Range   Sodium 127 (L) 135 - 145 mmol/L   Potassium 4.4 3.5 - 5.1 mmol/L   Chloride 89 (L) 98 - 111 mmol/L   CO2 25 22 - 32 mmol/L   Glucose, Bld 824 (HH) 70 - 99 mg/dL    Comment: CRITICAL RESULT CALLED TO, READ BACK BY AND VERIFIED WITH M,MCIVER RN @1244  01/30/23 E,BENTON Glucose reference range applies only to samples taken after fasting for at least 8 hours.    BUN 10 6 - 20 mg/dL   Creatinine, Ser  0.92 0.44 - 1.00 mg/dL   Calcium 8.9 8.9 - 16.1 mg/dL   Total Protein 7.4 6.5 - 8.1 g/dL   Albumin 3.1 (L) 3.5 - 5.0 g/dL   AST 096 (H) 15 - 41 U/L   ALT 147 (H) 0 - 44 U/L   Alkaline Phosphatase 578 (H) 38 - 126 U/L   Total Bilirubin 1.0 0.3 - 1.2 mg/dL   GFR, Estimated >04 >54 mL/min    Comment: (NOTE) Calculated using the CKD-EPI Creatinine Equation (2021)    Anion gap 13 5 - 15    Comment: Performed at [facility], [address], [place], [place] [address]  CBC with Differential     Status: None   Collection Time: 01/30/23 11:14 AM  Result Value Ref Range   WBC 9.2 4.0 - 10.5 K/uL    RBC 4.81 3.87 - 5.11 MIL/uL   Hemoglobin 12.6 12.0 - 15.0 g/dL   HCT 91.4 78.2 - 95.6 %   MCV 83.2 80.0 - 100.0 fL   MCH 26.2 26.0 - 34.0 pg   MCHC 31.5 30.0 - 36.0 g/dL   RDW 21.3 08.6 - 57.8 %   Platelets 376 150 - 400 K/uL   nRBC 0.0 0.0 - 0.2 %   Neutrophils Relative % 72 %   Neutro Abs 6.6 1.7 - 7.7 K/uL   Lymphocytes Relative 19 %   Lymphs Abs 1.8 0.7 - 4.0 K/uL   Monocytes Relative 7 %   Monocytes Absolute 0.7 0.1 - 1.0 K/uL   Eosinophils Relative 0 %   Eosinophils Absolute 0.0 0.0 - 0.5 K/uL   Basophils Relative 1 %   Basophils Absolute 0.1 0.0 - 0.1 K/uL   Immature Granulocytes 1 %   Abs Immature Granulocytes 0.05 0.00 - 0.07 K/uL    Comment: Performed at [facility], [address], [place], [place] [address]  Beta-hydroxybutyric acid     Status: Abnormal   Collection Time: 01/30/23 12:49 PM  Result Value Ref Range   Beta-Hydroxybutyric Acid 0.48 (H) 0.05 - 0.27 mmol/L    Comment: Performed at [facility], [address], [place], [place] [address]  Lactic acid, plasma     Status: None   Collection Time: 01/30/23  1:11 PM  Result Value Ref Range   Lactic Acid, Venous 1.8 0.5 - 1.9 mmol/L    Comment: Performed at [facility], [address] [place], [place] [address]  I-Stat venous blood gas, [facility] ED, [facility], [facility])     Status: Abnormal   Collection Time: 01/30/23  1:21 PM  Result Value Ref Range   pH, Ven 7.416 7.25 - 7.43   pCO2, Ven 45.4 44 - 60 mmHg   pO2, Ven 26 (LL) 32 - 45 mmHg   Bicarbonate 29.2 (H) 20.0 - 28.0 mmol/L   TCO2 31 22 - 32 mmol/L   O2 Saturation 48 %   Acid-Base Excess 4.0 (H) 0.0 - 2.0 mmol/L   Sodium 130 (L) 135 - 145 mmol/L   Potassium 4.2 3.5 - 5.1 mmol/L   Calcium, Ion 1.12 (L) 1.15 - 1.40 mmol/L   HCT 46.0 36.0 - 46.0 %   Hemoglobin 15.6 (H) 12.0 - 15.0 g/dL   Sample type VENOUS    Comment NOTIFIED PHYSICIAN   CBG monitoring, ED     Status: Abnormal   Collection Time: 01/30/23  1:35 PM  Result Value Ref Range    Glucose-Capillary 521 (HH) 70 - 99 mg/dL  Comment: Glucose reference range applies only to samples taken after fasting for at least 8 hours.   Comment 1 Notify RN    Comment 2 Document in Chart   CBG monitoring, ED     Status: Abnormal   Collection Time: 01/30/23  2:43 PM  Result Value Ref Range   Glucose-Capillary 416 (H) 70 - 99 mg/dL    Comment: Glucose reference range applies only to samples taken after fasting for at least 8 hours.   No results found.  Pending Labs Unresulted Labs (From admission, onward)     Start     Ordered   01/31/23 0500  Comprehensive metabolic panel  Tomorrow morning,   R        01/30/23 1430   01/31/23 0500  CBC  Tomorrow morning,   R        01/30/23 1430   01/30/23 1428  Acetaminophen level  Once,   STAT        01/30/23 1427   01/30/23 1428  Hepatitis panel, acute  Once,   URGENT        01/30/23 1427   01/30/23 1426  Osmolality  Add-on,   AD        01/30/23 1430   01/30/23 1424  HIV Antibody (routine testing w rflx)  (HIV Antibody (Routine testing w reflex) panel)  Once,   R        01/30/23 1430   01/30/23 1257  Urinalysis, Routine w reflex microscopic -Urine, Clean Catch  (Hyperglycemia (not DKA or HHS))  ONCE - STAT,   URGENT       Question:  Specimen Source  Answer:  Urine, Clean Catch   01/30/23 1256   01/30/23 1249  Blood culture (routine x 2)  BLOOD CULTURE X 2,   R      01/30/23 1248            Vitals/Pain Today's Vitals   01/30/23 1107 01/30/23 1107 01/30/23 1450  BP:  (!) 162/93 (!) 160/101  Pulse:  (!) 120   Resp:  18   Temp:  98 F (36.7 C)   SpO2:  100%   Weight: 74.8 kg    Height: 5\' 5"  (1.651 m)    PainSc: 6       Isolation Precautions No active isolations  Medications Medications  insulin regular, human (MYXREDLIN) 100 units/ 100 mL infusion (15 Units/hr Intravenous New Bag/Given 01/30/23 1409)  lactated ringers infusion ( Intravenous New Bag/Given 01/30/23 1454)  dextrose 5 % in lactated ringers infusion (0  mLs Intravenous Hold 01/30/23 1423)  dextrose 50 % solution 0-50 mL (has no administration in time range)  potassium chloride 10 mEq in 100 mL IVPB (10 mEq Intravenous New Bag/Given 01/30/23 1423)  vancomycin (VANCOCIN) IVPB 1000 mg/200 mL premix (1,000 mg Intravenous New Bag/Given 01/30/23 1419)  ceFEPIme (MAXIPIME) 2 g in sodium chloride 0.9 % 100 mL IVPB (2 g Intravenous New Bag/Given 01/30/23 1357)  lisinopril (ZESTRIL) tablet 20 mg (has no administration in time range)  sodium chloride flush (NS) 0.9 % injection 3 mL (has no administration in time range)  acetaminophen (TYLENOL) tablet 650 mg (has no administration in time range)    Or  acetaminophen (TYLENOL) suppository 650 mg (has no administration in time range)  lactated ringers bolus 1,000 mL (1,000 mLs Intravenous New Bag/Given 01/30/23 1352)    Mobility walks     Focused Assessments    R Recommendations: See Admitting Provider Note  Report given to:  Additional Notes:

## 2023-01-30 NOTE — Discharge Instructions (Addendum)
Your abscess is significant to the buttocks and I am concerned that you may have a developing severe infection, therefore I would like for you to go to the nearest emergency room for this to be evaluated further.

## 2023-01-30 NOTE — ED Provider Notes (Signed)
[facility] DE[facility]MENT AT [facility] Provider Note   CSN: [id number] Arrival date & time: 01/30/23  1057     History  Chief Complaint  Patient presents with   Abscess    Lisa Cross is a 52 y.o. female.  Patient presents from urgent care with concern for abscess to her left buttock.  States that has been there for the past 2 weeks.  Denies any significant drainage but states there has been some yellow discharge.  No fevers, chills, nausea or vomiting.  Has had abscesses like this in the past but never required drainage.  She is a diabetic and has been out of her metformin for several months.  No fever.  No chest pain, shortness of breath, nausea or vomiting.  No pain with urination or blood in the urine.  Urgent care referred to the ED with concern for deeper infection.  The history is provided by the patient.  Abscess Associated symptoms: no fever, no headaches, no nausea and no vomiting        Home Medications Prior to Admission medications   Medication Sig Start Date End Date Taking? Authorizing Provider  lisinopril (ZESTRIL) 20 MG tablet Take by mouth. 12/01/18   [provider]  metFORMIN (GLUCOPHAGE) 500 MG tablet Take 1 tablet (500 mg total) by mouth daily with breakfast. 05/30/17   Michela Pitcher A, PA-C      Allergies    Patient has no known allergies.    Review of Systems   Review of Systems  Constitutional:  Negative for activity change, appetite change and fever.  HENT:  Negative for congestion and rhinorrhea.   Respiratory:  Negative for cough, chest tightness and shortness of breath.   Cardiovascular:  Negative for chest pain.  Gastrointestinal:  Negative for abdominal pain, nausea and vomiting.  Genitourinary:  Negative for dysuria and hematuria.  Musculoskeletal:  Negative for arthralgias and myalgias.  Skin:  Positive for wound.  Neurological:  Positive for weakness. Negative for dizziness and headaches.    all other systems  are negative except as noted in the HPI and PMH.   Physical Exam Updated Vital Signs BP (!) 162/93 (BP Location: Right Arm)   Pulse (!) 120   Temp 98 F (36.7 C)   Resp 18   Ht 5\' 5"  (1.651 m)   Wt 74.8 kg   SpO2 100%   BMI 27.46 kg/m  Physical Exam Vitals and nursing note reviewed.  Constitutional:      General: She is not in acute distress.    Appearance: She is well-developed.  HENT:     Head: Normocephalic and atraumatic.     Mouth/Throat:     Pharynx: No oropharyngeal exudate.  Eyes:     Conjunctiva/sclera: Conjunctivae normal.     Pupils: Pupils are equal, round, and reactive to light.  Neck:     Comments: No meningismus. Cardiovascular:     Rate and Rhythm: Regular rhythm. Tachycardia present.     Heart sounds: Normal heart sounds. No murmur heard. Pulmonary:     Effort: Pulmonary effort is normal. No respiratory distress.     Breath sounds: Normal breath sounds.  Abdominal:     Palpations: Abdomen is soft.     Tenderness: There is no abdominal tenderness. There is no guarding or rebound.  Genitourinary:    Comments: Chaperone present Sports coach.  There is a large area of induration to the left upper buttock approximately 5 x 6 cm with some ulcerations  and surrounding erythema.  Multiple scattered pustules throughout bilateral buttocks. Musculoskeletal:        General: No tenderness. Normal range of motion.     Cervical back: Normal range of motion and neck supple.  Skin:    General: Skin is warm.  Neurological:     Mental Status: She is alert and oriented to person, place, and time.     Cranial Nerves: No cranial nerve deficit.     Motor: No abnormal muscle tone.     Coordination: Coordination normal.     Comments:  5/5 strength throughout. CN 2-12 intact.Equal grip strength.   Psychiatric:        Behavior: Behavior normal.     ED Results / Procedures / Treatments   Labs (all labs ordered are listed, but only abnormal results are displayed) Labs  Reviewed  LACTIC ACID, PLASMA - Abnormal; Notable for the following components:      Result Value   Lactic Acid, Venous 2.2 (*)    All other components within normal limits  COMPREHENSIVE METABOLIC PANEL - Abnormal; Notable for the following components:   Sodium 127 (*)    Chloride 89 (*)    Glucose, Bld 824 (*)    Albumin 3.1 (*)    AST 100 (*)    ALT 147 (*)    Alkaline Phosphatase 578 (*)    All other components within normal limits  I-STAT VENOUS BLOOD GAS, ED - Abnormal; Notable for the following components:   pO2, Ven 26 (*)    Bicarbonate 29.2 (*)    Acid-Base Excess 4.0 (*)    Sodium 130 (*)    Calcium, Ion 1.12 (*)    Hemoglobin 15.6 (*)    All other components within normal limits  CBG MONITORING, ED - Abnormal; Notable for the following components:   Glucose-Capillary 521 (*)    All other components within normal limits  CULTURE, BLOOD (ROUTINE X 2)  CULTURE, BLOOD (ROUTINE X 2)  CBC WITH DIFFERENTIAL/PLATELET  LACTIC ACID, PLASMA  BETA-HYDROXYBUTYRIC ACID  URINALYSIS, ROUTINE W REFLEX MICROSCOPIC  ACETAMINOPHEN LEVEL  HEPATITIS PANEL, ACUTE  HIV ANTIBODY (ROUTINE TESTING W REFLEX)  OSMOLALITY    EKG None  Radiology No results found.  Procedures .Critical Care  Performed by: Glynn Octave, MD Authorized by: Glynn Octave, MD   Critical care provider statement:    Critical care time (minutes):  35   Critical care time was exclusive of:  Separately billable procedures and treating other patients   Critical care was necessary to treat or prevent imminent or life-threatening deterioration of the following conditions:  Sepsis   Critical care was time spent personally by me on the following activities:  Development of treatment plan with patient or surrogate, discussions with consultants, evaluation of patient's response to treatment, examination of patient, ordering and review of laboratory studies, ordering and review of radiographic studies, ordering  and performing treatments and interventions, pulse oximetry, re-evaluation of patient's condition, review of old charts, blood draw for specimens and obtaining history from patient or surrogate   I assumed direction of critical care for this patient from another provider in my specialty: no     Care discussed with: admitting provider       Medications Ordered in ED Medications  lactated ringers bolus 1,000 mL (has no administration in time range)  insulin regular, human (MYXREDLIN) 100 units/ 100 mL infusion (has no administration in time range)  lactated ringers infusion (has no administration in time range)  dextrose 5 % in lactated ringers infusion (has no administration in time range)  dextrose 50 % solution 0-50 mL (has no administration in time range)    ED Course/ Medical Decision Making/ A&P                             Medical Decision Making Amount and/or Complexity of Data Reviewed Labs: ordered. Decision-making details documented in ED Course. Radiology: ordered and independent interpretation performed. Decision-making details documented in ED Course. ECG/medicine tests: ordered and independent interpretation performed. Decision-making details documented in ED Course.  Risk Prescription drug management.  Diabetic with left buttock abscess.  Tachycardic on arrival.  Blood sugar over 800 with normal anion gap.  She has been out of her metformin.  Initiate IV fluids and IV insulin  Hyperglycemia without DKA.  Mild hyponatremia secondary hyperglycemia.  Continue IV fluids and IV antibiotics after cultures are obtained.  Discussed general surgery Dr. Sheliah Hatch.  He has seen patient canceled CT scan.  Plans to take patient to the OR later today for incision and drainage.  Recommends medical admission  Labs show transaminitis of uncertain etiology.  She denies any abdominal pain.  Will check acetaminophen level as well as acute hepatitis panel.  May benefit from right upper  quadrant ultrasound.  Continue IV fluids and IV antibiotics.  Patient to be admitted for blood sugar control as well as incision and drainage in the operating room later today.  Discussed with Dr. Alinda Money.       Final Clinical Impression(s) / ED Diagnoses Final diagnoses:  None    Rx / DC Orders ED Discharge Orders     None         Tashona Calk, Jeannett Senior, MD 01/30/23 1431

## 2023-01-31 ENCOUNTER — Other Ambulatory Visit (HOSPITAL_COMMUNITY): Payer: Self-pay

## 2023-01-31 ENCOUNTER — Encounter (HOSPITAL_COMMUNITY): Payer: Self-pay | Admitting: General Surgery

## 2023-01-31 DIAGNOSIS — E1165 Type 2 diabetes mellitus with hyperglycemia: Secondary | ICD-10-CM | POA: Diagnosis not present

## 2023-01-31 DIAGNOSIS — R7989 Other specified abnormal findings of blood chemistry: Secondary | ICD-10-CM | POA: Diagnosis present

## 2023-01-31 DIAGNOSIS — R739 Hyperglycemia, unspecified: Secondary | ICD-10-CM | POA: Diagnosis present

## 2023-01-31 DIAGNOSIS — Z90710 Acquired absence of both cervix and uterus: Secondary | ICD-10-CM | POA: Diagnosis not present

## 2023-01-31 DIAGNOSIS — Z7984 Long term (current) use of oral hypoglycemic drugs: Secondary | ICD-10-CM | POA: Diagnosis not present

## 2023-01-31 DIAGNOSIS — L0231 Cutaneous abscess of buttock: Secondary | ICD-10-CM | POA: Diagnosis not present

## 2023-01-31 DIAGNOSIS — D1803 Hemangioma of intra-abdominal structures: Secondary | ICD-10-CM | POA: Diagnosis present

## 2023-01-31 DIAGNOSIS — I10 Essential (primary) hypertension: Secondary | ICD-10-CM

## 2023-01-31 DIAGNOSIS — Z9049 Acquired absence of other specified parts of digestive tract: Secondary | ICD-10-CM | POA: Diagnosis not present

## 2023-01-31 DIAGNOSIS — D649 Anemia, unspecified: Secondary | ICD-10-CM | POA: Diagnosis present

## 2023-01-31 DIAGNOSIS — E871 Hypo-osmolality and hyponatremia: Secondary | ICD-10-CM | POA: Diagnosis present

## 2023-01-31 DIAGNOSIS — Z8249 Family history of ischemic heart disease and other diseases of the circulatory system: Secondary | ICD-10-CM | POA: Diagnosis not present

## 2023-01-31 DIAGNOSIS — R7401 Elevation of levels of liver transaminase levels: Secondary | ICD-10-CM | POA: Diagnosis present

## 2023-01-31 LAB — GLUCOSE, CAPILLARY
Glucose-Capillary: 176 mg/dL — ABNORMAL HIGH (ref 70–99)
Glucose-Capillary: 198 mg/dL — ABNORMAL HIGH (ref 70–99)
Glucose-Capillary: 286 mg/dL — ABNORMAL HIGH (ref 70–99)
Glucose-Capillary: 290 mg/dL — ABNORMAL HIGH (ref 70–99)
Glucose-Capillary: 305 mg/dL — ABNORMAL HIGH (ref 70–99)
Glucose-Capillary: 322 mg/dL — ABNORMAL HIGH (ref 70–99)

## 2023-01-31 LAB — COMPREHENSIVE METABOLIC PANEL
ALT: 207 U/L — ABNORMAL HIGH (ref 0–44)
AST: 286 U/L — ABNORMAL HIGH (ref 15–41)
Albumin: 2.4 g/dL — ABNORMAL LOW (ref 3.5–5.0)
Alkaline Phosphatase: 408 U/L — ABNORMAL HIGH (ref 38–126)
Anion gap: 9 (ref 5–15)
BUN: 8 mg/dL (ref 6–20)
CO2: 27 mmol/L (ref 22–32)
Calcium: 8.5 mg/dL — ABNORMAL LOW (ref 8.9–10.3)
Chloride: 102 mmol/L (ref 98–111)
Creatinine, Ser: 0.68 mg/dL (ref 0.44–1.00)
GFR, Estimated: >60 mL/min (ref >60)
Glucose, Bld: 212 mg/dL — ABNORMAL HIGH (ref 70–99)
Potassium: 3.7 mmol/L (ref 3.5–5.1)
Sodium: 138 mmol/L (ref 135–145)
Total Bilirubin: 0.7 mg/dL (ref 0.3–1.2)
Total Protein: 5.9 g/dL — ABNORMAL LOW (ref 6.5–8.1)

## 2023-01-31 LAB — CBC
HCT: 33.7 % — ABNORMAL LOW (ref 36.0–46.0)
Hemoglobin: 11.1 g/dL — ABNORMAL LOW (ref 12.0–15.0)
MCH: 26.3 pg (ref 26.0–34.0)
MCHC: 32.9 g/dL (ref 30.0–36.0)
MCV: 79.9 fL — ABNORMAL LOW (ref 80.0–100.0)
Platelets: 318 10*3/uL (ref 150–400)
RBC: 4.22 MIL/uL (ref 3.87–5.11)
RDW: 14.1 % (ref 11.5–15.5)
WBC: 11.5 10*3/uL — ABNORMAL HIGH (ref 4.0–10.5)
nRBC: 0 % (ref 0.0–0.2)

## 2023-01-31 MED ORDER — INSULIN ASPART 100 UNIT/ML IJ SOLN
4.0000 [IU] | Freq: Three times a day (TID) | INTRAMUSCULAR | Status: DC
  Administered 2023-01-31 – 2023-02-02 (×6): 4 [IU] via SUBCUTANEOUS

## 2023-01-31 MED ORDER — ENOXAPARIN SODIUM 40 MG/0.4ML IJ SOSY
40.0000 mg | PREFILLED_SYRINGE | INTRAMUSCULAR | Status: DC
  Administered 2023-01-31 – 2023-02-01 (×2): 40 mg via SUBCUTANEOUS
  Filled 2023-01-31 (×2): qty 0.4

## 2023-01-31 MED ORDER — LIVING WELL WITH DIABETES BOOK
Freq: Once | Status: DC
  Filled 2023-01-31: qty 1

## 2023-01-31 MED ORDER — AMLODIPINE BESYLATE 5 MG PO TABS
5.0000 mg | ORAL_TABLET | Freq: Every day | ORAL | Status: DC
  Administered 2023-01-31 – 2023-02-02 (×3): 5 mg via ORAL
  Filled 2023-01-31 (×3): qty 1

## 2023-01-31 NOTE — Inpatient Diabetes Management (Signed)
Inpatient Diabetes Program Recommendations  AACE/ADA: New Consensus Statement on Inpatient Glycemic Control (2015)  Target Ranges:  Prepandial:   less than 140 mg/dL      Peak postprandial:   less than 180 mg/dL (1-2 hours)      Critically ill patients:  140 - 180 mg/dL   Lab Results  Component Value Date   GLUCAP 322 (H) 01/31/2023    Review of Glycemic Control  Diabetes history: type 2 Outpatient Diabetes medications: Metformin 500 mg daily Current orders for Inpatient glycemic control: semglee 40 units daily, Novolog 0-15 units correction scale TID, Novolog 0-5 units HS scale, Novolog 4 units TID  Inpatient Diabetes Program Recommendations:   Spoke with patient at the bedside. Patient does not remember how long she has had diabetes. States that she has a fear of needles and would prefer not taking insulin. She was asking about oral agents that she might could take instead of insulin. Showed her the insulin pen and discussed administration. Patient was able to return demonstration without issues. Staff RN to have patient practice giving insulin injections.  Discussed diet, hypoglycemia, treatment of low blood sugar. Did benefit check on insulins; see note. Patient states that her father has diabetes and takes insulin. She lives with her mother. Will order Living Well with Diabetes booklet.   Will continue to monitor blood sugars while in the hospital.  Smith Mince RN BSN CDE Diabetes Coordinator Pager: [phone]  8am-5pm

## 2023-01-31 NOTE — Progress Notes (Signed)
1 Day Post-Op   Subjective/Chief Complaint: Complains only of soreness. Tolerating diet   Objective: Vital signs in last 24 hours: Temp:  [97.4 F (36.3 C)-98.8 F (37.1 C)] 98.6 F (37 C) (05/27 0803) Pulse Rate:  [88-120] 88 (05/27 0334) Resp:  [0-20] 15 (05/27 0334) BP: (144-168)/(62-102) 168/102 (05/27 0803) SpO2:  [95 %-100 %] 95 % (05/27 0334) Weight:  [74.8 kg-75 kg] 75 kg (05/27 0500)    Intake/Output from previous day: 05/26 0701 - 05/27 0700 In: 2341.9 [I.V.:952.2; IV Piggyback:1389.6] Out: 20 [Blood:20] Intake/Output this shift: No intake/output data recorded.  General appearance: alert and cooperative Resp: clear to auscultation bilaterally Cardio: regular rate and rhythm GI: soft, non-tender; bowel sounds normal; no masses,  no organomegaly  Lab Results:  Recent Labs    01/30/23 1114 01/30/23 1321 01/31/23 0342  WBC 9.2  --  11.5*  HGB 12.6 15.6* 11.1*  HCT 40.0 46.0 33.7*  PLT 376  --  318   BMET Recent Labs    01/30/23 1114 01/30/23 1321 01/31/23 0342  NA 127* 130* 138  K 4.4 4.2 3.7  CL 89*  --  102  CO2 25  --  27  GLUCOSE 824*  --  212*  BUN 10  --  8  CREATININE 0.92  --  0.68  CALCIUM 8.9  --  8.5*   PT/INR No results for input(s): "LABPROT", "INR" in the last 72 hours. ABG Recent Labs    01/30/23 1321  HCO3 29.2*    Studies/Results: US Abdomen Limited RUQ (LIVER/GB)  Result Date: 01/30/2023 CLINICAL DATA:  Elevated LFTs. EXAM: ULTRASOUND ABDOMEN LIMITED RIGHT UPPER QUADRANT COMPARISON:  Abdominal ultrasound dated May 2007. FINDINGS: Gallbladder: Surgically absent. Common bile duct: Diameter: 5 mm, normal. Liver: 2.0 x 1.4 x 1.7 cm round echogenic lesion right liver. Within normal limits in parenchymal echogenicity. Portal vein is patent on color Doppler imaging with normal direction of blood flow towards the liver. Other: None. IMPRESSION: 1. No acute abnormality. 2. 2.0 cm hyperechoic lesion in the right lobe of the liver,  probably a hemangioma, benign. If definitive characterization is required, consider follow-up MRI abdomen without and with contrast in 6 months. Electronically Signed   By: Obie Dredge M.D.   On: 01/30/2023 15:59    Anti-infectives: Anti-infectives (From admission, onward)    Start     Dose/Rate Route Frequency Ordered Stop   01/30/23 1315  vancomycin (VANCOCIN) IVPB 1000 mg/200 mL premix        1,000 mg 200 mL/hr over 60 Minutes Intravenous  Once 01/30/23 1306 01/30/23 1528   01/30/23 1315  ceFEPIme (MAXIPIME) 2 g in sodium chloride 0.9 % 100 mL IVPB        2 g 200 mL/hr over 30 Minutes Intravenous Every 8 hours 01/30/23 1310         Assessment/Plan: s/p Procedure(s): IRRIGATION AND DEBRIDEMENT BUTTOCKS (N/A) Advance diet Start dressing changes today Continue IV abx until wbc normal and wound clean Diabetes. Cbg's and insulin  LOS: 0 days    Lisa Cross 01/31/2023

## 2023-01-31 NOTE — TOC Benefit Eligibility Note (Signed)
Patient Product/process development scientist completed.    The patient is currently admitted and upon discharge could be taking Novolog.  The current 30 day co-pay is $10.00, 90 day supply $20.00.   The patient is currently admitted and upon discharge could be taking Humalog.  The current 30 day co-pay is PA Required.   The patient is currently admitted and upon discharge could be taking Levemir.  The current 30 day co-pay is PA Required.   The patient is currently admitted and upon discharge could be taking Lantus.  The current 30 day co-pay is PA Required.   The patient is currently admitted and upon discharge could be taking [place].  The current 30 day co-pay is $10, 90 day supply $20.00.   The patient is insured through [place]

## 2023-01-31 NOTE — Progress Notes (Signed)
PROGRESS NOTE        PATIENT DETAILS Name: Lisa Cross Age: 52 y.o. Sex: female Date of Birth: 12-13-1970 Admit Date: 01/30/2023 Admitting Physician Synetta Fail, MD WUJ:WJXBJ, Joselyn Glassman, NP  Brief Summary: Patient is a 52 y.o.  female with history of DM-2, HTN-who presented with left gluteal abscess in the setting of uncontrolled hyperglycemia requiring IV insulin infusion.  Underwent I&D on 5/26.  See below for further details.   Significant events: 5/26>> admit to [facility]  Significant studies: 5/26>> RUQ ultrasound: No acute abnormality, 2.0 centimeters hypoechoic lesion in the right lobe of liver-probably a hemangioma.  Significant microbiology data: 5/26>> blood culture: No growth 5/26>> abscess/surgical cultures: Pending.  Procedures: 5/26>> left gluteal abscess incision and drainage  Consults: General surgery  Subjective: Lying comfortably in bed-denies any chest pain or shortness of breath.  Objective: Vitals: Blood pressure (!) 168/102, pulse 88, temperature 98.6 F (37 C), temperature source Oral, resp. rate 15, height 5\' 5"  (1.651 m), weight 75 kg, SpO2 95 %.   Exam: Gen Exam:Alert awake-not in any distress HEENT:atraumatic, normocephalic Chest: B/L clear to auscultation anteriorly CVS:S1S2 regular Abdomen:soft non tender, non distended Extremities:no edema Neurology: Non focal Skin: no rash  Pertinent Labs/Radiology:    Latest Ref Rng & Units 01/31/2023    3:42 AM 01/30/2023    1:21 PM 01/30/2023   11:14 AM  CBC  WBC 4.0 - 10.5 K/uL 11.5   9.2   Hemoglobin 12.0 - 15.0 g/dL 47.8  29.5  62.1   Hematocrit 36.0 - 46.0 % 33.7  46.0  40.0   Platelets 150 - 400 K/uL 318   376     Lab Results  Component Value Date   NA 138 01/31/2023   K 3.7 01/31/2023   CL 102 01/31/2023   CO2 27 01/31/2023      Assessment/Plan: Left gluteal abscess S/p I&D 5/26 Continue empiric IV antibiotics Follow cultures General surgery  following  DM-2 with uncontrolled hyperglycemia-probable hyperosmolar nonketotic state Titrated off IV insulin-now on SQ insulin Await A1c Just started on 40 units of Semglee with SSI last night-follow CBGs closely and adjust regimen accordingly. Begin insulin administration education/diabetic education.  Recent Labs    01/31/23 0034 01/31/23 0115 01/31/23 0851  GLUCAP 305* 286* 198*    Elevated transaminases Unclear etiology RUQ ultrasound/acute hepatitis serology negative ?  From gluteal abscess Repeat LFTs tomorrow-if still uptrending-may need further workup.  Note since she is completely asymptomatic  HTN BP elevated Add low-dose amlodipine Continue lisinopril.  BMI: Estimated body mass index is 27.51 kg/m as calculated from the following:   Height as of this encounter: 5\' 5"  (1.651 m).   Weight as of this encounter: 75 kg.   Code status:   Code Status: Full Code   DVT Prophylaxis: SCDs Start: 01/30/23 1425   Family Communication: None at bedside   Disposition Plan: Status is: Observation The patient will require care spanning > 2 midnights and should be moved to inpatient because: Severity of illness   Planned Discharge Destination:Home   Diet: Diet Order             Diet heart healthy/carb modified Room service appropriate? Yes; Fluid consistency: Thin  Diet effective now                     Antimicrobial agents: Anti-infectives (From  admission, onward)    Start     Dose/Rate Route Frequency Ordered Stop   01/30/23 1315  vancomycin (VANCOCIN) IVPB 1000 mg/200 mL premix        1,000 mg 200 mL/hr over 60 Minutes Intravenous  Once 01/30/23 1306 01/30/23 1528   01/30/23 1315  ceFEPIme (MAXIPIME) 2 g in sodium chloride 0.9 % 100 mL IVPB        2 g 200 mL/hr over 30 Minutes Intravenous Every 8 hours 01/30/23 1310          MEDICATIONS: Scheduled Meds:  insulin aspart  0-15 Units Subcutaneous TID WC   insulin aspart  0-5 Units  Subcutaneous QHS   insulin glargine-yfgn  40 Units Subcutaneous Daily   lisinopril  20 mg Oral Daily   sodium chloride flush  3 mL Intravenous Q12H   Continuous Infusions:  ceFEPime (MAXIPIME) IV 2 g (01/31/23 0443)   PRN Meds:.acetaminophen **OR** acetaminophen, dextrose   I have personally reviewed following labs and imaging studies  LABORATORY DATA: CBC: Recent Labs  Lab 01/30/23 1114 01/30/23 1321 01/31/23 0342  WBC 9.2  --  11.5*  NEUTROABS 6.6  --   --   HGB 12.6 15.6* 11.1*  HCT 40.0 46.0 33.7*  MCV 83.2  --  79.9*  PLT 376  --  318    Basic Metabolic Panel: Recent Labs  Lab 01/30/23 1114 01/30/23 1321 01/31/23 0342  NA 127* 130* 138  K 4.4 4.2 3.7  CL 89*  --  102  CO2 25  --  27  GLUCOSE 824*  --  212*  BUN 10  --  8  CREATININE 0.92  --  0.68  CALCIUM 8.9  --  8.5*    GFR: Estimated Creatinine Clearance: 84.3 mL/min (by C-G formula based on SCr of 0.68 mg/dL).  Liver Function Tests: Recent Labs  Lab 01/30/23 1114 01/31/23 0342  AST 100* 286*  ALT 147* 207*  ALKPHOS 578* 408*  BILITOT 1.0 0.7  PROT 7.4 5.9*  ALBUMIN 3.1* 2.4*   No results for input(s): "LIPASE", "AMYLASE" in the last 168 hours. No results for input(s): "AMMONIA" in the last 168 hours.  Coagulation Profile: No results for input(s): "INR", "PROTIME" in the last 168 hours.  Cardiac Enzymes: No results for input(s): "CKTOTAL", "CKMB", "CKMBINDEX", "TROPONINI" in the last 168 hours.  BNP (last 3 results) No results for input(s): "PROBNP" in the last 8760 hours.  Lipid Profile: No results for input(s): "CHOL", "HDL", "LDLCALC", "TRIG", "CHOLHDL", "LDLDIRECT" in the last 72 hours.  Thyroid Function Tests: No results for input(s): "TSH", "T4TOTAL", "FREET4", "T3FREE", "THYROIDAB" in the last 72 hours.  Anemia Panel: No results for input(s): "VITAMINB12", "FOLATE", "FERRITIN", "TIBC", "IRON", "RETICCTPCT" in the last 72 hours.  Urine analysis:    Component Value  Date/Time   BILIRUBINUR neg 12/03/2012 0929   PROTEINUR neg 12/03/2012 0929   UROBILINOGEN 0.2 12/03/2012 0929   NITRITE neg 12/03/2012 0929   LEUKOCYTESUR Negative 12/03/2012 0929    Sepsis Labs: Lactic Acid, Venous    Component Value Date/Time   LATICACIDVEN 1.8 01/30/2023 1311    MICROBIOLOGY: Recent Results (from the past 240 hour(s))  Blood culture (routine x 2)     Status: None (Preliminary result)   Collection Time: 01/30/23  1:11 PM   Specimen: BLOOD  Result Value Ref Range Status   Specimen Description BLOOD RIGHT ANTECUBITAL  Final   Special Requests   Final    BOTTLES DRAWN AEROBIC AND ANAEROBIC Blood Culture  results may not be optimal due to an inadequate volume of blood received in culture bottles   Culture   Final    NO GROWTH < 24 HOURS Performed at [facility], [address] [place], [place] [address]    Report Status PENDING  Incomplete  Blood culture (routine x 2)     Status: None (Preliminary result)   Collection Time: 01/30/23  1:31 PM   Specimen: BLOOD  Result Value Ref Range Status   Specimen Description BLOOD LEFT ANTECUBITAL  Final   Special Requests   Final    BOTTLES DRAWN AEROBIC AND ANAEROBIC Blood Culture results may not be optimal due to an inadequate volume of blood received in culture bottles   Culture   Final    NO GROWTH < 24 HOURS Performed at [facility], [address], [place], [place] [address]    Report Status PENDING  Incomplete  Aerobic/Anaerobic Culture w Gram Stain (surgical/deep wound)     Status: None (Preliminary result)   Collection Time: 01/30/23  5:15 PM   Specimen: Path fluid; Tissue  Result Value Ref Range Status   Specimen Description FLUID  Final   Special Requests ABSCESS  Final   Gram Stain   Final    FEW WBC PRESENT, PREDOMINANTLY PMN FEW GRAM POSITIVE COCCI IN PAIRS RARE GRAM POSITIVE RODS    Culture   Final    CULTURE REINCUBATED FOR BETTER GROWTH Performed at [facility], [address] [place], [place] [address]    Report Status PENDING  Incomplete    RADIOLOGY STUDIES/RESULTS: US Abdomen Limited RUQ (LIVER/GB)  Result Date: 01/30/2023 CLINICAL DATA:  Elevated LFTs. EXAM: ULTRASOUND ABDOMEN LIMITED RIGHT UPPER QUADRANT COMPARISON:  Abdominal ultrasound dated May 2007. FINDINGS: Gallbladder: Surgically absent. Common bile duct: Diameter: 5 mm, normal. Liver: 2.0 x 1.4 x 1.7 cm round echogenic lesion right liver. Within normal limits in parenchymal echogenicity. Portal vein is patent on color Doppler imaging with normal direction of blood flow towards the liver. Other: None. IMPRESSION: 1. No acute abnormality. 2. 2.0 cm hyperechoic lesion in the right lobe of the liver, probably a hemangioma, benign. If definitive characterization is required, consider follow-up MRI abdomen without and with contrast in 6 months. Electronically Signed   By: Obie Dredge M.D.   On: 01/30/2023 15:59     LOS: 0 days   Jeoffrey Massed, MD  [facility]    To contact the attending provider between 7A-7P or the covering provider during after hours 7P-7A, please log into the web site [email or web address] and access using universal [place] password for that web site. If you do not have the password, please call the hospital operator.  01/31/2023, 9:53 AM

## 2023-01-31 NOTE — Anesthesia Postprocedure Evaluation (Signed)
Anesthesia Post Note  Patient: Kirra Oliphant  Procedure(s) Performed: IRRIGATION AND DEBRIDEMENT BUTTOCKS (Buttocks)     Patient location during evaluation: PACU Anesthesia Type: General Level of consciousness: awake Pain management: pain level controlled Vital Signs Assessment: post-procedure vital signs reviewed and stable Respiratory status: spontaneous breathing, nonlabored ventilation and respiratory function stable Cardiovascular status: blood pressure returned to baseline and stable Postop Assessment: no apparent nausea or vomiting Anesthetic complications: no   No notable events documented.  Last Vitals:  Vitals:   01/30/23 2338 01/31/23 0334  BP: (!) 157/91 (!) 155/94  Pulse: 95 88  Resp: 20 15  Temp: 36.9 C 36.9 C  SpO2: 95% 95%    Last Pain:  Vitals:   01/31/23 0334  TempSrc: Oral  PainSc:                  Catheryn Bacon Maliha Outten

## 2023-02-01 DIAGNOSIS — E1165 Type 2 diabetes mellitus with hyperglycemia: Secondary | ICD-10-CM | POA: Diagnosis not present

## 2023-02-01 DIAGNOSIS — I10 Essential (primary) hypertension: Secondary | ICD-10-CM | POA: Diagnosis not present

## 2023-02-01 DIAGNOSIS — L0231 Cutaneous abscess of buttock: Secondary | ICD-10-CM | POA: Diagnosis not present

## 2023-02-01 LAB — CBC
HCT: 34.7 % — ABNORMAL LOW (ref 36.0–46.0)
Hemoglobin: 11.1 g/dL — ABNORMAL LOW (ref 12.0–15.0)
MCH: 25.8 pg — ABNORMAL LOW (ref 26.0–34.0)
MCHC: 32 g/dL (ref 30.0–36.0)
MCV: 80.5 fL (ref 80.0–100.0)
Platelets: 320 10*3/uL (ref 150–400)
RBC: 4.31 MIL/uL (ref 3.87–5.11)
RDW: 14.6 % (ref 11.5–15.5)
WBC: 9 10*3/uL (ref 4.0–10.5)
nRBC: 0 % (ref 0.0–0.2)

## 2023-02-01 LAB — GLUCOSE, CAPILLARY
Glucose-Capillary: 138 mg/dL — ABNORMAL HIGH (ref 70–99)
Glucose-Capillary: 251 mg/dL — ABNORMAL HIGH (ref 70–99)
Glucose-Capillary: 239 mg/dL — ABNORMAL HIGH (ref 70–99)
Glucose-Capillary: 197 mg/dL — ABNORMAL HIGH (ref 70–99)

## 2023-02-01 LAB — HEPATIC FUNCTION PANEL
ALT: 188 U/L — ABNORMAL HIGH (ref 0–44)
AST: 151 U/L — ABNORMAL HIGH (ref 15–41)
Albumin: 2.3 g/dL — ABNORMAL LOW (ref 3.5–5.0)
Alkaline Phosphatase: 476 U/L — ABNORMAL HIGH (ref 38–126)
Bilirubin, Direct: 0.3 mg/dL — ABNORMAL HIGH (ref 0.0–0.2)
Indirect Bilirubin: 0.4 mg/dL (ref 0.3–0.9)
Total Bilirubin: 0.7 mg/dL (ref 0.3–1.2)
Total Protein: 5.9 g/dL — ABNORMAL LOW (ref 6.5–8.1)

## 2023-02-01 LAB — BASIC METABOLIC PANEL
Anion gap: 9 (ref 5–15)
BUN: 17 mg/dL (ref 6–20)
CO2: 24 mmol/L (ref 22–32)
Calcium: 8.5 mg/dL — ABNORMAL LOW (ref 8.9–10.3)
Chloride: 102 mmol/L (ref 98–111)
Creatinine, Ser: 0.63 mg/dL (ref 0.44–1.00)
GFR, Estimated: >60 mL/min (ref >60)
Glucose, Bld: 133 mg/dL — ABNORMAL HIGH (ref 70–99)
Potassium: 3.6 mmol/L (ref 3.5–5.1)
Sodium: 135 mmol/L (ref 135–145)

## 2023-02-01 LAB — AEROBIC/ANAEROBIC CULTURE W GRAM STAIN (SURGICAL/DEEP WOUND)

## 2023-02-01 LAB — PROTIME-INR
INR: 1 (ref 0.8–1.2)
Prothrombin Time: 13.3 seconds (ref 11.4–15.2)

## 2023-02-01 LAB — HEMOGLOBIN A1C
Hgb A1c MFr Bld: >15.5 % — ABNORMAL HIGH (ref 4.8–5.6)
Mean Plasma Glucose: >398 mg/dL

## 2023-02-01 LAB — GAMMA GT: GGT: 1198 U/L — ABNORMAL HIGH (ref 7–50)

## 2023-02-01 MED ORDER — VANCOMYCIN HCL 750 MG/150ML IV SOLN: 750.0000 mg | Freq: Two times a day (BID) | INTRAVENOUS | Status: DC

## 2023-02-01 MED ORDER — LINEZOLID 600 MG PO TABS
600.0000 mg | ORAL_TABLET | Freq: Two times a day (BID) | ORAL | Status: DC
  Administered 2023-02-01 – 2023-02-02 (×2): 600 mg via ORAL
  Filled 2023-02-01 (×3): qty 1

## 2023-02-01 MED ORDER — VANCOMYCIN HCL 1250 MG/250ML IV SOLN
1250.0000 mg | Freq: Once | INTRAVENOUS | Status: AC
  Administered 2023-02-01: 1250 mg via INTRAVENOUS
  Filled 2023-02-01: qty 250

## 2023-02-01 NOTE — Consult Note (Addendum)
Referring Provider:  [facility] Primary Care Physician:  Deatra James, MD Primary Gastroenterologist:  Deboraha Sprang PCP   Reason for Consultation:   Abnormal LFTs   HPI: Lisa Cross is a 52 y.o. female with past medical history of hypertension and diabetes admitted to the hospital with left buttock abscess.  Underwent incision and drainage 2 days ago.  She was found to have elevated LFTs.  GI is consulted for further evaluation.  S/p cholecystectomy.  Blood work on admission on May same 26 showed AST of 100, ALT 147, alkaline phosphatase 578, normal T. bili.  Negative hepatitis panel.  Had slight upward trend in LFTs yesterday but LFTs been trending down today compared to yesterday.  Normal INR.  Normal platelet counts.  Ultrasound right upper quadrant showed no acute changes but showed 2 cm likely hemangioma in the right lobe of the liver.  Patient seen and examined at bedside.  She is comfortably eating lunch.  She denies any GI symptoms.  No known history of liver disease.  Denies any family history of liver disease.  Denies significant alcohol use.    Past Medical History:  Diagnosis Date   Anemia    Diabetes mellitus without complication (HCC)    Hypertension    Noncompliance with medications 10/30/2018   Formatting of this note might be different from the original. Patient is very resistant to taking medications she "believes they are poison", discussed benefits of treatment and discussed risks of non treatment    Past Surgical History:  Procedure Laterality Date   ABDOMINAL HYSTERECTOMY     BRAIN SURGERY     CHOLECYSTECTOMY     IRRIGATION AND DEBRIDEMENT BUTTOCKS N/A 01/30/2023   Procedure: IRRIGATION AND DEBRIDEMENT BUTTOCKS;  Surgeon: Sheliah Hatch De Blanch, MD;  Location: [facility] OR;  Service: General;  Laterality: N/A;    Prior to Admission medications   Medication Sig Start Date End Date Taking? Authorizing Provider  metFORMIN (GLUCOPHAGE) 500 MG tablet Take 1 tablet (500 mg total) by  mouth daily with breakfast. Patient not taking: Reported on 01/31/2023 05/30/17   Michela Pitcher A, PA-C    Scheduled Meds:  amLODipine  5 mg Oral Daily   enoxaparin (LOVENOX) injection  40 mg Subcutaneous Q24H   insulin aspart  0-15 Units Subcutaneous TID WC   insulin aspart  0-5 Units Subcutaneous QHS   insulin aspart  4 Units Subcutaneous TID WC   insulin glargine-yfgn  40 Units Subcutaneous Daily   linezolid  600 mg Oral BID   lisinopril  20 mg Oral Daily   living well with diabetes book   Does not apply Once   sodium chloride flush  3 mL Intravenous Q12H   Continuous Infusions: PRN Meds:.acetaminophen **OR** acetaminophen, dextrose  Allergies as of 01/30/2023   (No Known Allergies)    Family History  Problem Relation Age of Onset   Hypertension Mother    Hypertension Father    Dementia Maternal Grandfather    Hypertension Paternal Grandmother    Hypertension Paternal Grandfather     Social History   Socioeconomic History   Marital status: Divorced    Spouse name: Not on file   Number of children: Not on file   Years of education: Not on file   Highest education level: Not on file  Occupational History   Not on file  Tobacco Use   Smoking status: Never   Smokeless tobacco: Never  Vaping Use   Vaping Use: Not on file  Substance and Sexual Activity  Alcohol use: No    Alcohol/week: 0.0 standard drinks of alcohol   Drug use: No   Sexual activity: Yes    Birth control/protection: Condom  Other Topics Concern   Not on file  Social History Narrative   Not on file   Social Determinants of Health   Financial Resource Strain: Not on file  Food Insecurity: Not on file  Transportation Needs: Not on file  Physical Activity: Not on file  Stress: Not on file  Social Connections: Not on file  Intimate Partner Violence: Not on file    Review of Systems: All negative except as stated above in HPI.  Physical Exam: Vital signs: Vitals:   02/01/23 0451 02/01/23  0807  BP: (!) 167/106 (!) 155/96  Pulse: 94   Resp: 18 19  Temp: 98.3 F (36.8 C) 98 F (36.7 C)  SpO2:  99%     General:   Alert,  Well-developed, well-nourished, pleasant and cooperative in NAD Lungs: No visible respiratory distress Heart:  Regular rate and rhythm; no murmurs, clicks, rubs,  or gallops. Abdomen: Soft, nontender, nondistended, bowel sounds present, no peritoneal signs Alert and oriented x 3 Mood and affect normal Rectal:  Deferred  GI:  Lab Results: Recent Labs    01/30/23 1114 01/30/23 1321 01/31/23 0342 02/01/23 0141  WBC 9.2  --  11.5* 9.0  HGB 12.6 15.6* 11.1* 11.1*  HCT 40.0 46.0 33.7* 34.7*  PLT 376  --  318 320   BMET Recent Labs    01/30/23 1114 01/30/23 1321 01/31/23 0342 02/01/23 0141  NA 127* 130* 138 135  K 4.4 4.2 3.7 3.6  CL 89*  --  102 102  CO2 25  --  27 24  GLUCOSE 824*  --  212* 133*  BUN 10  --  8 17  CREATININE 0.92  --  0.68 0.63  CALCIUM 8.9  --  8.5* 8.5*   LFT Recent Labs    02/01/23 0141  PROT 5.9*  ALBUMIN 2.3*  AST 151*  ALT 188*  ALKPHOS 476*  BILITOT 0.7  BILIDIR 0.3*  IBILI 0.4   PT/INR No results for input(s): "LABPROT", "INR" in the last 72 hours.   Studies/Results: US Abdomen Limited RUQ (LIVER/GB)  Result Date: 01/30/2023 CLINICAL DATA:  Elevated LFTs. EXAM: ULTRASOUND ABDOMEN LIMITED RIGHT UPPER QUADRANT COMPARISON:  Abdominal ultrasound dated May 2007. FINDINGS: Gallbladder: Surgically absent. Common bile duct: Diameter: 5 mm, normal. Liver: 2.0 x 1.4 x 1.7 cm round echogenic lesion right liver. Within normal limits in parenchymal echogenicity. Portal vein is patent on color Doppler imaging with normal direction of blood flow towards the liver. Other: None. IMPRESSION: 1. No acute abnormality. 2. 2.0 cm hyperechoic lesion in the right lobe of the liver, probably a hemangioma, benign. If definitive characterization is required, consider follow-up MRI abdomen without and with contrast in 6 months.  Electronically Signed   By: Obie Dredge M.D.   On: 01/30/2023 15:59    Impression/Plan: -Abnormal LFTs.  Alk phos 476, AST ALT 151 and ALT 88.  Ultrasound showed 2 cm hyperechoic lesion probably hemangioma. -S/p I&D of buttock abscess 2 days ago.  Recommendations -------------------------- -Hepatitis panel negative so far. -Check GGT .  Follow ANA, ASMA and antimitochondrial antibody. -Consider outpatient repeat ultrasound or MRI in 6 months for follow-up on 2 cm hemangioma - okay to discharge from GI standpoint.  Workup for abnormal LFTs can be done as an outpatient.  If patient gets discharged, recommend follow-up in  GI clinic in 2 to 3 months after discharge and also recommend to repeat LFTs with PCP in 2 weeks.    LOS: 1 day   Kathi Der  MD, FACP 02/01/2023, 10:53 AM  Contact #  [phone]

## 2023-02-01 NOTE — Progress Notes (Signed)
PROGRESS NOTE        PATIENT DETAILS Name: Lisa Cross Age: 52 y.o. Sex: female Date of Birth: [birth year] Admit Date: 01/30/2023 Admitting Physician Dewayne Shorter Levora Dredge, MD PCP:Sun, Charise Carwin, MD  Brief Summary: Patient is a 52 y.o.  female with history of DM-2, HTN-who presented with left gluteal abscess in the setting of uncontrolled hyperglycemia requiring IV insulin infusion.  Underwent I&D on 5/26.  See below for further details.   Significant events: 5/26>> admit to [facility]  Significant studies: 5/26>> RUQ ultrasound: No acute abnormality, 2.0 centimeters hypoechoic lesion in the right lobe of liver-probably a hemangioma.  Significant microbiology data: 5/26>> blood culture: No growth 5/26>> abscess/surgical cultures: MRSA  Procedures: 5/26>> left gluteal abscess incision and drainage  Consults: General surgery  Subjective: No major issues overnight.  Objective: Vitals: Blood pressure (!) 155/96, pulse 94, temperature 98 F (36.7 C), temperature source Oral, resp. rate 19, height 5\' 5"  (1.651 m), weight 65 kg, SpO2 99 %.   Exam: Gen Exam:Alert awake-not in any distress HEENT:atraumatic, normocephalic Chest: B/L clear to auscultation anteriorly CVS:S1S2 regular Abdomen:soft non tender, non distended Extremities:no edema Neurology: Non focal Skin: no rash  Pertinent Labs/Radiology:    Latest Ref Rng & Units 02/01/2023    1:41 AM 01/31/2023    3:42 AM 01/30/2023    1:21 PM  CBC  WBC 4.0 - 10.5 K/uL 9.0  11.5    Hemoglobin 12.0 - 15.0 g/dL 95.1  88.4  16.6   Hematocrit 36.0 - 46.0 % 34.7  33.7  46.0   Platelets 150 - 400 K/uL 320  318      Lab Results  Component Value Date   NA 135 02/01/2023   K 3.6 02/01/2023   CL 102 02/01/2023   CO2 24 02/01/2023      Assessment/Plan: Left gluteal abscess S/p I&D 5/26 MRSA and wound cultures-being transition to oral Zyvox today Advance diet Mobilize Wound care per general surgery.     DM-2 with uncontrolled hyperglycemia-probable hyperosmolar nonketotic state Initially on IV insulin infusion-has been subsequently transition to SQ insulin CBGs stable on 40 units of Semglee and SSI Continue diabetic education/insulin administration education  Awaiting A1c  Recent Labs    01/31/23 1546 01/31/23 2020 02/01/23 0804  GLUCAP 290* 176* 138*     Elevated transaminases Unclear etiology-already had elevated LFTs on presentation.  Doubt it is related to her abscess at this point.  Do not see any major offending medications. RUQ ultrasound/acute hepatitis serology negative Since continues to have persistent transaminitis-will order autoimmune workup GI consulted today.  HTN BP stable Continue amlodipine/lisinopril.   BMI: Estimated body mass index is 23.85 kg/m as calculated from the following:   Height as of this encounter: 5\' 5"  (1.651 m).   Weight as of this encounter: 65 kg.   Code status:   Code Status: Full Code   DVT Prophylaxis: enoxaparin (LOVENOX) injection 40 mg Start: 01/31/23 1100 SCDs Start: 01/30/23 1425   Family Communication: None at bedside   Disposition Plan: Status is: Observation The patient will require care spanning > 2 midnights and should be moved to inpatient because: Severity of illness   Planned Discharge Destination:Home   Diet: Diet Order             Diet heart healthy/carb modified Room service appropriate? Yes; Fluid consistency: Thin  Diet effective now  Antimicrobial agents: Anti-infectives (From admission, onward)    Start     Dose/Rate Route Frequency Ordered Stop   02/01/23 2000  vancomycin (VANCOREADY) IVPB 750 mg/150 mL  Status:  Discontinued        750 mg 150 mL/hr over 60 Minutes Intravenous Every 12 hours 02/01/23 0717 02/01/23 1028   02/01/23 2000  linezolid (ZYVOX) tablet 600 mg        600 mg Oral 2 times daily 02/01/23 1028     02/01/23 0800  vancomycin (VANCOREADY) IVPB  1250 mg/250 mL        1,250 mg 166.7 mL/hr over 90 Minutes Intravenous  Once 02/01/23 0714 02/01/23 1025   01/30/23 1315  vancomycin (VANCOCIN) IVPB 1000 mg/200 mL premix        1,000 mg 200 mL/hr over 60 Minutes Intravenous  Once 01/30/23 1306 01/30/23 1528   01/30/23 1315  ceFEPIme (MAXIPIME) 2 g in sodium chloride 0.9 % 100 mL IVPB  Status:  Discontinued        2 g 200 mL/hr over 30 Minutes Intravenous Every 8 hours 01/30/23 1310 02/01/23 0659        MEDICATIONS: Scheduled Meds:  amLODipine  5 mg Oral Daily   enoxaparin (LOVENOX) injection  40 mg Subcutaneous Q24H   insulin aspart  0-15 Units Subcutaneous TID WC   insulin aspart  0-5 Units Subcutaneous QHS   insulin aspart  4 Units Subcutaneous TID WC   insulin glargine-yfgn  40 Units Subcutaneous Daily   linezolid  600 mg Oral BID   lisinopril  20 mg Oral Daily   living well with diabetes book   Does not apply Once   sodium chloride flush  3 mL Intravenous Q12H   Continuous Infusions:   PRN Meds:.acetaminophen **OR** acetaminophen, dextrose   I have personally reviewed following labs and imaging studies  LABORATORY DATA: CBC: Recent Labs  Lab 01/30/23 1114 01/30/23 1321 01/31/23 0342 02/01/23 0141  WBC 9.2  --  11.5* 9.0  NEUTROABS 6.6  --   --   --   HGB 12.6 15.6* 11.1* 11.1*  HCT 40.0 46.0 33.7* 34.7*  MCV 83.2  --  79.9* 80.5  PLT 376  --  318 320     Basic Metabolic Panel: Recent Labs  Lab 01/30/23 1114 01/30/23 1321 01/31/23 0342 02/01/23 0141  NA 127* 130* 138 135  K 4.4 4.2 3.7 3.6  CL 89*  --  102 102  CO2 25  --  27 24  GLUCOSE 824*  --  212* 133*  BUN 10  --  8 17  CREATININE 0.92  --  0.68 0.63  CALCIUM 8.9  --  8.5* 8.5*     GFR: Estimated Creatinine Clearance: 74.9 mL/min (by C-G formula based on SCr of 0.63 mg/dL).  Liver Function Tests: Recent Labs  Lab 01/30/23 1114 01/31/23 0342 02/01/23 0141  AST 100* 286* 151*  ALT 147* 207* 188*  ALKPHOS 578* 408* 476*  BILITOT  1.0 0.7 0.7  PROT 7.4 5.9* 5.9*  ALBUMIN 3.1* 2.4* 2.3*    No results for input(s): "LIPASE", "AMYLASE" in the last 168 hours. No results for input(s): "AMMONIA" in the last 168 hours.  Coagulation Profile: Recent Labs  Lab 02/01/23 1021  INR 1.0    Cardiac Enzymes: No results for input(s): "CKTOTAL", "CKMB", "CKMBINDEX", "TROPONINI" in the last 168 hours.  BNP (last 3 results) No results for input(s): "PROBNP" in the last 8760 hours.  Lipid Profile: No results for  input(s): "CHOL", "HDL", "LDLCALC", "TRIG", "CHOLHDL", "LDLDIRECT" in the last 72 hours.  Thyroid Function Tests: No results for input(s): "TSH", "T4TOTAL", "FREET4", "T3FREE", "THYROIDAB" in the last 72 hours.  Anemia Panel: No results for input(s): "VITAMINB12", "FOLATE", "FERRITIN", "TIBC", "IRON", "RETICCTPCT" in the last 72 hours.  Urine analysis:    Component Value Date/Time   BILIRUBINUR neg 12/03/2012 0929   PROTEINUR neg 12/03/2012 0929   UROBILINOGEN 0.2 12/03/2012 0929   NITRITE neg 12/03/2012 0929   LEUKOCYTESUR Negative 12/03/2012 0929    Sepsis Labs: Lactic Acid, Venous    Component Value Date/Time   LATICACIDVEN 1.8 01/30/2023 1311    MICROBIOLOGY: Recent Results (from the past 240 hour(s))  Blood culture (routine x 2)     Status: None (Preliminary result)   Collection Time: 01/30/23  1:11 PM   Specimen: BLOOD  Result Value Ref Range Status   Specimen Description BLOOD RIGHT ANTECUBITAL  Final   Special Requests   Final    BOTTLES DRAWN AEROBIC AND ANAEROBIC Blood Culture results may not be optimal due to an inadequate volume of blood received in culture bottles   Culture   Final    NO GROWTH 2 DAYS Performed at [facility], [address] [place], [place] [address]    Report Status PENDING  Incomplete  Blood culture (routine x 2)     Status: None (Preliminary result)   Collection Time: 01/30/23  1:31 PM   Specimen: BLOOD  Result Value Ref Range Status   Specimen  Description BLOOD LEFT ANTECUBITAL  Final   Special Requests   Final    BOTTLES DRAWN AEROBIC AND ANAEROBIC Blood Culture results may not be optimal due to an inadequate volume of blood received in culture bottles   Culture   Final    NO GROWTH 2 DAYS Performed at [facility], [address] [place], [place] [address]    Report Status PENDING  Incomplete  Aerobic/Anaerobic Culture w Gram Stain (surgical/deep wound)     Status: None (Preliminary result)   Collection Time: 01/30/23  5:15 PM   Specimen: Path fluid; Tissue  Result Value Ref Range Status   Specimen Description FLUID  Final   Special Requests ABSCESS  Final   Gram Stain   Final    FEW WBC PRESENT, PREDOMINANTLY PMN FEW GRAM POSITIVE COCCI IN PAIRS RARE GRAM POSITIVE RODS    Culture   Final    MODERATE METHICILLIN RESISTANT STAPHYLOCOCCUS AUREUS HOLDING FOR POSSIBLE ANAEROBE Performed at [facility], [address], [place], [place] [address]    Report Status PENDING  Incomplete   Organism ID, Bacteria METHICILLIN RESISTANT STAPHYLOCOCCUS AUREUS  Final      Susceptibility   Methicillin resistant staphylococcus aureus - MIC*    CIPROFLOXACIN >=8 RESISTANT Resistant     ERYTHROMYCIN >=8 RESISTANT Resistant     GENTAMICIN <=0.5 SENSITIVE Sensitive     OXACILLIN >=4 RESISTANT Resistant     TETRACYCLINE <=1 SENSITIVE Sensitive     VANCOMYCIN 1 SENSITIVE Sensitive     TRIMETH/SULFA <=10 SENSITIVE Sensitive     CLINDAMYCIN <=0.25 SENSITIVE Sensitive     RIFAMPIN <=0.5 SENSITIVE Sensitive     Inducible Clindamycin NEGATIVE Sensitive     LINEZOLID 2 SENSITIVE Sensitive     * MODERATE METHICILLIN RESISTANT STAPHYLOCOCCUS AUREUS    RADIOLOGY STUDIES/RESULTS: US Abdomen Limited RUQ (LIVER/GB)  Result Date: 01/30/2023 CLINICAL DATA:  Elevated LFTs. EXAM: ULTRASOUND ABDOMEN LIMITED RIGHT UPPER QUADRANT COMPARISON:  Abdominal ultrasound dated May 2007. FINDINGS: Gallbladder: Surgically absent. Common bile duct:  Diameter: 5 mm, normal. Liver: 2.0 x 1.4 x 1.7 cm round echogenic lesion right liver. Within normal limits in parenchymal echogenicity. Portal vein is patent on color Doppler imaging with normal direction of blood flow towards the liver. Other: None. IMPRESSION: 1. No acute abnormality. 2. 2.0 cm hyperechoic lesion in the right lobe of the liver, probably a hemangioma, benign. If definitive characterization is required, consider follow-up MRI abdomen without and with contrast in 6 months. Electronically Signed   By: Obie Dredge M.D.   On: 01/30/2023 15:59     LOS: 1 day   Jeoffrey Massed, MD  [facility]    To contact the attending provider between 7A-7P or the covering provider during after hours 7P-7A, please log into the web site [email or web address] and access using universal [place] password for that web site. If you do not have the password, please call the hospital operator.  02/01/2023, 11:29 AM

## 2023-02-01 NOTE — Inpatient Diabetes Management (Signed)
Inpatient Diabetes Program Recommendations  AACE/ADA: New Consensus Statement on Inpatient Glycemic Control (2015)  Target Ranges:  Prepandial:   less than 140 mg/dL      Peak postprandial:   less than 180 mg/dL (1-2 hours)      Critically ill patients:  140 - 180 mg/dL   Lab Results  Component Value Date   GLUCAP 251 (H) 02/01/2023   HGBA1C >15.5 (H) 01/31/2023    Review of Glycemic Control  Diabetes history: DM2 Outpatient Diabetes medications: metformin 500 mg QD Current orders for Inpatient glycemic control: Semglee 40 QD, Novolog 0-15 TID with meals and 0-5 HS + 4 units TID  Needs tight glycemic control for healing.  Inpatient Diabetes Program Recommendations:    Consider increasing Semglee to 42 units QD  Consider increasing Novolog to 6 units TID with meals if eating > 50%  Continue to follow.  Thank you. Ailene Ards, RD, LDN, CDCES Inpatient Diabetes Coordinator [phone]

## 2023-02-01 NOTE — TOC Initial Note (Signed)
Transition of Care [facility]) - Initial/Assessment Note    Patient Details  Name: Lisa Cross MRN: [id number] Date of Birth: Sep 20, 1970  [facility]) CM/SW Contact:    Gordy Clement, RN Phone Number: 02/01/2023, 2:59 PM  Clinical Narrative:    Patient from home.  Will dc on oral ABX  Anticipate no TOC needs                  Expected Discharge Plan: Home/Self Care Barriers to Discharge: Continued Medical Work up   Patient Goals and CMS Choice            Expected Discharge Plan and Services       Living arrangements for the past 2 months: Apartment                                      Prior Living Arrangements/Services Living arrangements for the past 2 months: Apartment                     Activities of Daily Living      Permission Sought/Granted                  Emotional Assessment           Psych Involvement: No (comment)  Admission diagnosis:  Hyperglycemia [R73.9] Abscess of buttock, left [L02.31] Left buttock abscess [L02.31] Patient Active Problem List   Diagnosis Date Noted   Left buttock abscess 01/30/2023   Noncompliance by refusing intervention or support 04/28/2019   Diabetes mellitus (HCC) 12/01/2018   Essential hypertension 02/06/2016   Hyperglycemia due to diabetes mellitus (HCC) 08/28/2015   PCP:  Deatra James, MD Pharmacy:   [id number] - [place], [place] - [address] AT [address] OF [address] [place] [address] Phone: [phone] Fax: [phone]  [id number] [place], [place] - [address] AT [facility] OF [address] [place] [address] Phone: [phone] Fax: [phone]     Social Determinants of Health (SDOH) Social History: SDOH Screenings   Tobacco Use: Low Risk  (01/31/2023)   SDOH Interventions:     Readmission Risk Interventions     No data to display

## 2023-02-01 NOTE — Progress Notes (Signed)
Pharmacy Antibiotic Note  Lisa Cross is a 52 y.o. female admitted on 01/30/2023 with gluteal abscess growing Staph aureus (sensitivities still in process).  Pharmacy has been consulted for vancomycin dosing.  Plan: Vancomycin 1250mg  x1 then 750mg  IV Q 12 hrs. Goal AUC 400-550. Expected AUC: 480 SCr used: 0.8   Height: 5\' 5"  (165.1 cm) Weight: 65 kg (143 lb 4.8 oz) IBW/kg (Calculated) : 57  Temp (24hrs), Avg:98.8 F (37.1 C), Min:98.3 F (36.8 C), Max:99.5 F (37.5 C)  Recent Labs  Lab 01/30/23 1114 01/30/23 1311 01/31/23 0342 02/01/23 0141  WBC 9.2  --  11.5* 9.0  CREATININE 0.92  --  0.68 0.63  LATICACIDVEN 2.2* 1.8  --   --     Estimated Creatinine Clearance: 74.9 mL/min (by C-G formula based on SCr of 0.63 mg/dL).    No Known Allergies  Thank you for allowing pharmacy to be a part of this patient's care.  Vernard Gambles, PharmD, BCPS  02/01/2023 7:18 AM

## 2023-02-01 NOTE — Progress Notes (Signed)
Ok to change vanc to PO zyvox for MRSA per Dr Jerral Ralph.  Ulyses Southward, PharmD, BCIDP, AAHIVP, CPP Infectious Disease Pharmacist 02/01/2023 10:28 AM

## 2023-02-01 NOTE — Progress Notes (Signed)
2 Days Post-Op   Subjective/Chief Complaint: Complains only of soreness. Tolerating diet   Objective: Vital signs in last 24 hours: Temp:  [98 F (36.7 C)-99.5 F (37.5 C)] 98 F (36.7 C) (05/28 0807) Pulse Rate:  [88-94] 94 (05/28 0451) Resp:  [15-20] 19 (05/28 0807) BP: (141-167)/(84-106) 155/96 (05/28 0807) SpO2:  [94 %-99 %] 99 % (05/28 0807) Weight:  [65 kg] 65 kg (05/28 0455)    Intake/Output from previous day: 05/27 0701 - 05/28 0700 In: 1630 [P.O.:1330; IV Piggyback:300] Out: -  Intake/Output this shift: No intake/output data recorded.  General appearance: alert and cooperative Resp: clear to auscultation bilaterally Cardio: regular rate and rhythm GI: soft, non-tender; bowel sounds normal; no masses,  no organomegaly Wound along the medial left buttock with Penrose in place, some residual edema but no significant cellulitis or drainage at this time  Lab Results:  Recent Labs    01/31/23 0342 02/01/23 0141  WBC 11.5* 9.0  HGB 11.1* 11.1*  HCT 33.7* 34.7*  PLT 318 320    BMET Recent Labs    01/31/23 0342 02/01/23 0141  NA 138 135  K 3.7 3.6  CL 102 102  CO2 27 24  GLUCOSE 212* 133*  BUN 8 17  CREATININE 0.68 0.63  CALCIUM 8.5* 8.5*    PT/INR No results for input(s): "LABPROT", "INR" in the last 72 hours. ABG Recent Labs    01/30/23 1321  HCO3 29.2*     Studies/Results: US Abdomen Limited RUQ (LIVER/GB)  Result Date: 01/30/2023 CLINICAL DATA:  Elevated LFTs. EXAM: ULTRASOUND ABDOMEN LIMITED RIGHT UPPER QUADRANT COMPARISON:  Abdominal ultrasound dated May 2007. FINDINGS: Gallbladder: Surgically absent. Common bile duct: Diameter: 5 mm, normal. Liver: 2.0 x 1.4 x 1.7 cm round echogenic lesion right liver. Within normal limits in parenchymal echogenicity. Portal vein is patent on color Doppler imaging with normal direction of blood flow towards the liver. Other: None. IMPRESSION: 1. No acute abnormality. 2. 2.0 cm hyperechoic lesion in the  right lobe of the liver, probably a hemangioma, benign. If definitive characterization is required, consider follow-up MRI abdomen without and with contrast in 6 months. Electronically Signed   By: Obie Dredge M.D.   On: 01/30/2023 15:59    Anti-infectives: Anti-infectives (From admission, onward)    Start     Dose/Rate Route Frequency Ordered Stop   02/01/23 2000  vancomycin (VANCOREADY) IVPB 750 mg/150 mL        750 mg 150 mL/hr over 60 Minutes Intravenous Every 12 hours 02/01/23 0717     02/01/23 0800  vancomycin (VANCOREADY) IVPB 1250 mg/250 mL        1,250 mg 166.7 mL/hr over 90 Minutes Intravenous  Once 02/01/23 0714     01/30/23 1315  vancomycin (VANCOCIN) IVPB 1000 mg/200 mL premix        1,000 mg 200 mL/hr over 60 Minutes Intravenous  Once 01/30/23 1306 01/30/23 1528   01/30/23 1315  ceFEPIme (MAXIPIME) 2 g in sodium chloride 0.9 % 100 mL IVPB  Status:  Discontinued        2 g 200 mL/hr over 30 Minutes Intravenous Every 8 hours 01/30/23 1310 02/01/23 0659       Assessment/Plan: s/p Procedure(s): IRRIGATION AND DEBRIDEMENT BUTTOCKS (N/A) Advance diet Continue dressing changes Can transition to oral antibiotics from surgery standpoint. Diabetes. Cbg's and insulin   LOS: 1 day    Berna Bue 02/01/2023

## 2023-02-02 ENCOUNTER — Other Ambulatory Visit (HOSPITAL_COMMUNITY): Payer: Self-pay

## 2023-02-02 ENCOUNTER — Telehealth (HOSPITAL_COMMUNITY): Payer: Self-pay | Admitting: Pharmacy Technician

## 2023-02-02 DIAGNOSIS — E1165 Type 2 diabetes mellitus with hyperglycemia: Secondary | ICD-10-CM | POA: Diagnosis not present

## 2023-02-02 DIAGNOSIS — L0231 Cutaneous abscess of buttock: Secondary | ICD-10-CM | POA: Diagnosis not present

## 2023-02-02 LAB — HEPATIC FUNCTION PANEL
ALT: 160 U/L — ABNORMAL HIGH (ref 0–44)
AST: 119 U/L — ABNORMAL HIGH (ref 15–41)
Albumin: 2.5 g/dL — ABNORMAL LOW (ref 3.5–5.0)
Alkaline Phosphatase: 476 U/L — ABNORMAL HIGH (ref 38–126)
Bilirubin, Direct: 0.2 mg/dL (ref 0.0–0.2)
Indirect Bilirubin: 0.5 mg/dL (ref 0.3–0.9)
Total Bilirubin: 0.7 mg/dL (ref 0.3–1.2)
Total Protein: 6.3 g/dL — ABNORMAL LOW (ref 6.5–8.1)

## 2023-02-02 LAB — ANTI-SMOOTH MUSCLE ANTIBODY, IGG: F-Actin IgG: 7 Units (ref 0–19)

## 2023-02-02 LAB — AEROBIC/ANAEROBIC CULTURE W GRAM STAIN (SURGICAL/DEEP WOUND)

## 2023-02-02 LAB — ANA W/REFLEX IF POSITIVE: Anti Nuclear Antibody (ANA): NEGATIVE

## 2023-02-02 LAB — GLUCOSE, CAPILLARY: Glucose-Capillary: 112 mg/dL — ABNORMAL HIGH (ref 70–99)

## 2023-02-02 MED ORDER — ONDANSETRON HCL 4 MG/2ML IJ SOLN
4.0000 mg | Freq: Four times a day (QID) | INTRAMUSCULAR | Status: DC | PRN
  Administered 2023-02-02: 4 mg via INTRAVENOUS
  Filled 2023-02-02: qty 2

## 2023-02-02 MED ORDER — INSULIN LISPRO (1 UNIT DIAL) 100 UNIT/ML (KWIKPEN)
PEN_INJECTOR | SUBCUTANEOUS | 1 refills | Status: AC
  Filled 2023-02-02: qty 15, 34d supply, fill #0

## 2023-02-02 MED ORDER — BLOOD GLUCOSE MONITOR SYSTEM W/DEVICE KIT
1.0000 | PACK | Freq: Three times a day (TID) | 0 refills | Status: AC
  Filled 2023-02-02: qty 1, 30d supply, fill #0

## 2023-02-02 MED ORDER — INSULIN PEN NEEDLE 32G X 4 MM MISC
1.0000 | 0 refills | Status: AC | PRN
  Filled 2023-02-02 – 2023-08-01 (×3): qty 100, 30d supply, fill #0

## 2023-02-02 MED ORDER — FINGERSTIX LANCETS MISC
1.0000 | Freq: Three times a day (TID) | 0 refills | Status: AC
  Filled 2023-02-02: qty 100, 30d supply, fill #0

## 2023-02-02 MED ORDER — FREESTYLE LIBRE 2 SENSOR MISC
0 refills | Status: AC
  Filled 2023-02-02: qty 30, fill #0
  Filled 2023-02-02: qty 2, 28d supply, fill #0
  Filled 2023-02-21: qty 2, 28d supply, fill #1
  Filled 2023-03-08: qty 2, 28d supply, fill #0

## 2023-02-02 MED ORDER — LISINOPRIL 20 MG PO TABS
20.0000 mg | ORAL_TABLET | Freq: Every day | ORAL | 1 refills | Status: AC
  Filled 2023-02-02: qty 30, 30d supply, fill #0

## 2023-02-02 MED ORDER — LINEZOLID 600 MG PO TABS
600.0000 mg | ORAL_TABLET | Freq: Two times a day (BID) | ORAL | 0 refills | Status: AC
  Filled 2023-02-02: qty 12, 6d supply, fill #0

## 2023-02-02 MED ORDER — AMLODIPINE BESYLATE 5 MG PO TABS
5.0000 mg | ORAL_TABLET | Freq: Every day | ORAL | 1 refills | Status: AC
  Filled 2023-02-02: qty 30, 30d supply, fill #0

## 2023-02-02 MED ORDER — BLOOD GLUCOSE TEST VI STRP
1.0000 | ORAL_STRIP | Freq: Three times a day (TID) | 1 refills | Status: AC
  Filled 2023-02-02: qty 100, 30d supply, fill #0

## 2023-02-02 MED ORDER — BASAGLAR KWIKPEN 100 UNIT/ML ~~LOC~~ SOPN
40.0000 [IU] | PEN_INJECTOR | Freq: Every day | SUBCUTANEOUS | 1 refills | Status: AC
  Filled 2023-02-02: qty 12, 30d supply, fill #0
  Filled 2023-10-10: qty 18, 45d supply, fill #0

## 2023-02-02 MED ORDER — LANCET DEVICE MISC
1.0000 | Freq: Three times a day (TID) | 0 refills | Status: AC
  Filled 2023-02-02: qty 1, 30d supply, fill #0

## 2023-02-02 MED ORDER — METFORMIN HCL 500 MG PO TABS: 500.0000 mg | ORAL_TABLET | Freq: Two times a day (BID) | ORAL | 1 refills | Status: DC

## 2023-02-02 NOTE — Discharge Summary (Signed)
PATIENT DETAILS Name: Lisa Cross Age: 52 y.o. Sex: female Date of Birth: 1970/12/07 MRN: [id number]. Admitting Physician: Maretta Bees, MD PCP:Sun, Charise Carwin, MD  Admit Date: 01/30/2023 Discharge date: 02/02/2023  Recommendations for Outpatient Follow-up:  Follow up with PCP in 1-2 weeks Please obtain CMP/CBC in one week Follow ANA/anti-smooth muscle/antimitochondrial antibody Ensure follow-up with gastroenterology for elevated LFTs  Admitted From:  Home  Disposition: Home   Discharge Condition: good  CODE STATUS:   Code Status: Full Code   Diet recommendation:  Diet Order             Diet - low sodium heart healthy           Diet Carb Modified           Diet heart healthy/carb modified Room service appropriate? Yes; Fluid consistency: Thin  Diet effective now                    Brief Summary: Patient is a 52 y.o.  female with history of DM-2, HTN-who presented with left gluteal abscess in the setting of uncontrolled hyperglycemia requiring IV insulin infusion.  Underwent I&D on 5/26.  See below for further details.    Significant events: 5/26>> admit to [facility]   Significant studies: 5/26>> RUQ ultrasound: No acute abnormality, 2.0 centimeters hypoechoic lesion in the right lobe of liver-probably a hemangioma.   Significant microbiology data: 5/26>> blood culture: No growth 5/26>> abscess/surgical cultures: MRSA   Procedures: 5/26>> left gluteal abscess incision and drainage   Consults: General surgery GI   Brief Hospital Course: Left gluteal abscess S/p I&D 5/26 MRSA and wound cultures-has been transitioned to Zyvox Wound care per general surgery-they will arrange for follow-up Continue Zyvox on discharge.   DM-2 with uncontrolled hyperglycemia-probable hyperosmolar nonketotic state (A1c> 15.5) Initially on IV insulin infusion-has been subsequently transition to SQ insulin CBGs stable on 40 units of Semglee and SSI Resume metformin on  discharge Follow-up with PCP for further optimization Note extensive diabetic education/insulin administration education completed by RN staff-patient aware of symptoms/needed intervention for hypoglycemia.   Elevated transaminases Unclear etiology-already had elevated LFTs on presentation.  Doubt it is related to her abscess at this point.  Do not see any major offending medications. RUQ ultrasound/acute hepatitis serology negative Autoimmune workup pending-please follow-please ensure follow-up with [facility] gastroenterology.     HTN BP stable Continue amlodipine/lisinopril.    BMI: Estimated body mass index is 23.85 kg/m as calculated from the following:   Height as of this encounter: 5\' 5"  (1.651 m).   Weight as of this encounter: 65 kg.  Discharge Diagnoses:  Principal Problem:   Left buttock abscess Active Problems:   Essential hypertension   Diabetes mellitus (HCC)   Hyperglycemia due to diabetes mellitus [facility])   Discharge Instructions:  Activity:  As tolerated    Discharge Instructions     Call MD for:  extreme fatigue   Complete by: As directed    Call MD for:  persistant dizziness or light-headedness   Complete by: As directed    Call MD for:  persistant nausea and vomiting   Complete by: As directed    Diet - low sodium heart healthy   Complete by: As directed    Diet Carb Modified   Complete by: As directed    Discharge instructions   Complete by: As directed    Follow with Primary MD  Deatra James, MD in 1-2 weeks  Please get a complete blood count  and chemistry panel checked by your Primary MD at your next visit, and again as instructed by your Primary MD.  You have elevated liver function tests-some blood work (autoimmune workup is pending-ANA, ASMA, AMA)-please ask your primary care practitioner to follow-up on these results.  You will need to follow-up with [facility] gastroenterology for further workup.  Since your liver enzymes are elevated-please do not  take any over-the-counter medications unless you discussed with your primary care practitioner/primary gastroenterologist, similarly-please let primary care practitioner/other doctors know that your liver enzymes elevated before they put you on other prescription medications.    Get Medicines reviewed and adjusted: Please take all your medications with you for your next visit with your Primary MD  Laboratory/radiological data: Please request your Primary MD to go over all hospital tests and procedure/radiological results at the follow up, please ask your Primary MD to get all Hospital records sent to his/her office.  In some cases, they will be blood work, cultures and biopsy results pending at the time of your discharge. Please request that your primary care M.D. follows up on these results.  Also Note the following: If you experience worsening of your admission symptoms, develop shortness of breath, life threatening emergency, suicidal or homicidal thoughts you must seek medical attention immediately by calling 911 or calling your MD immediately  if symptoms less severe.  You must read complete instructions/literature along with all the possible adverse reactions/side effects for all the Medicines you take and that have been prescribed to you. Take any new Medicines after you have completely understood and accpet all the possible adverse reactions/side effects.   Do not drive when taking Pain medications or sleeping medications (Benzodaizepines)  Do not take more than prescribed Pain, Sleep and Anxiety Medications. It is not advisable to combine anxiety,sleep and pain medications without talking with your primary care practitioner  Special Instructions: If you have smoked or chewed Tobacco  in the last 2 yrs please stop smoking, stop any regular Alcohol  and or any Recreational drug use.  Wear Seat belts while driving.  Please note: You were cared for by a hospitalist during your hospital  stay. Once you are discharged, your primary care physician will handle any further medical issues. Please note that NO REFILLS for any discharge medications will be authorized once you are discharged, as it is imperative that you return to your primary care physician (or establish a relationship with a primary care physician if you do not have one) for your post hospital discharge needs so that they can reassess your need for medications and monitor your lab values.   Discharge wound care:   Complete by: As directed    Change dressing daily or when saturated. Please follow general surgery instructions   Increase activity slowly   Complete by: As directed       Allergies as of 02/02/2023   No Known Allergies      Medication List     TAKE these medications    amLODipine 5 MG tablet Commonly known as: NORVASC Take 1 tablet (5 mg total) by mouth daily.   Basaglar KwikPen 100 UNIT/ML Inject 40 Units into the skin daily.   Blood Glucose Monitoring Suppl Devi 1 each by Does not apply route in the morning, at noon, and at bedtime. May substitute to any manufacturer covered by patient's insurance.   BLOOD GLUCOSE TEST STRIPS Strp 1 each by In Vitro route in the morning, at noon, and at bedtime. May substitute to  any manufacturer covered by [employer].   FreeStyle Libre 2 Sensor Misc Please use as instructed   insulin lispro 100 UNIT/ML injection Commonly known as: HUMALOG 0-15 Units, Subcutaneous, 3 times daily with meals, CBG < 70: Implement Hypoglycemia measures CBG 70 - 120: 0 units CBG 121 - 150: 2 units CBG 151 - 200: 3 units CBG 201 - 250: 5 units CBG 251 - 300: 8 units CBG 301 - 350: 11 units CBG 351 - 400: 15 units CBG > 400: call MD   Insulin Pen Needle 32G X 4 MM Misc 1 each by Does not apply route as needed.   Lancet Device Misc 1 each by Does not apply route in the morning, at noon, and at bedtime. May substitute to any manufacturer covered by patient's  insurance.   Lancets Misc. Misc 1 each by Does not apply route in the morning, at noon, and at bedtime. May substitute to any manufacturer covered by patient's insurance.   linezolid 600 MG tablet Commonly known as: ZYVOX Take 1 tablet (600 mg total) by mouth 2 (two) times daily for 6 days.   lisinopril 20 MG tablet Commonly known as: ZESTRIL Take 1 tablet (20 mg total) by mouth daily. What changed:  how much to take when to take this   metFORMIN 500 MG tablet Commonly known as: GLUCOPHAGE Take 1 tablet (500 mg total) by mouth 2 (two) times daily with a meal. What changed: when to take this               Discharge Care Instructions  (From admission, onward)           Start     Ordered   02/02/23 0000  Discharge wound care:       Comments: Change dressing daily or when saturated. Please follow general surgery instructions   02/02/23 0836            Follow-up Information     Maczis, Hedda Slade, PA-C. Call in 1 week(s).   Specialty: General Surgery Why: For wound re-check. We are working on scheduling follow up, please call to confirm appointment date/time. Please arrive 30 min prior to appointment time. Contact information: [address] [place] [address] [facility] [place] [address] [phone]         Deatra James, MD. Schedule an appointment as soon as possible for a visit in 1 week(s).   Specialty: Family Medicine Contact information: [phone] [address] [place] [address] [phone]         Kathi Der, MD. Schedule an appointment as soon as possible for a visit in 2 week(s).   Specialty: Gastroenterology Contact information: [address] [place] [address] [phone]                No Known Allergies   Other Procedures/Studies: US Abdomen Limited RUQ (LIVER/GB)  Result Date: 01/30/2023 CLINICAL DATA:  Elevated LFTs. EXAM: ULTRASOUND ABDOMEN LIMITED RIGHT UPPER QUADRANT  COMPARISON:  Abdominal ultrasound dated May 2007. FINDINGS: Gallbladder: Surgically absent. Common bile duct: Diameter: 5 mm, normal. Liver: 2.0 x 1.4 x 1.7 cm round echogenic lesion right liver. Within normal limits in parenchymal echogenicity. Portal vein is patent on color Doppler imaging with normal direction of blood flow towards the liver. Other: None. IMPRESSION: 1. No acute abnormality. 2. 2.0 cm hyperechoic lesion in the right lobe of the liver, probably a hemangioma, benign. If definitive characterization is required, consider follow-up MRI abdomen  without and with contrast in 6 months. Electronically Signed   By: Obie Dredge M.D.   On: 01/30/2023 15:59     TODAY-DAY OF DISCHARGE:  Subjective:   Lisa Cross today has no headache,no chest abdominal pain,no new weakness tingling or numbness, feels much better wants to go home today.   Objective:   Blood pressure (!) 153/98, pulse 100, temperature 98.3 F (36.8 C), temperature source Oral, resp. rate 18, height 5\' 5"  (1.651 m), weight 64.2 kg, SpO2 98 %. No intake or output data in the 24 hours ending 02/02/23 0843 Filed Weights   01/31/23 0500 02/01/23 0455 02/02/23 0442  Weight: 75 kg 65 kg 64.2 kg    Exam: Awake Alert, Oriented *3, No new F.N deficits, Normal affect [place].AT,PERRAL Supple Neck,No JVD, No cervical lymphadenopathy appriciated.  Symmetrical Chest wall movement, Good air movement bilaterally, CTAB RRR,No Gallops,Rubs or new Murmurs, No Parasternal Heave +ve B.Sounds, Abd Soft, Non tender, No organomegaly appriciated, No rebound -guarding or rigidity. No Cyanosis, Clubbing or edema, No new Rash or bruise   PERTINENT RADIOLOGIC STUDIES: No results found.   PERTINENT LAB RESULTS: CBC: Recent Labs    01/31/23 0342 02/01/23 0141  WBC 11.5* 9.0  HGB 11.1* 11.1*  HCT 33.7* 34.7*  PLT 318 320   CMET CMP     Component Value Date/Time   NA 135 02/01/2023 0141   K 3.6 02/01/2023 0141   CL 102 02/01/2023  0141   CO2 24 02/01/2023 0141   GLUCOSE 133 (H) 02/01/2023 0141   BUN 17 02/01/2023 0141   CREATININE 0.63 02/01/2023 0141   CREATININE 0.81 12/03/2012 0919   CALCIUM 8.5 (L) 02/01/2023 0141   PROT 6.3 (L) 02/02/2023 0426   ALBUMIN 2.5 (L) 02/02/2023 0426   AST 119 (H) 02/02/2023 0426   ALT 160 (H) 02/02/2023 0426   ALKPHOS 476 (H) 02/02/2023 0426   BILITOT 0.7 02/02/2023 0426   GFRNONAA >60 02/01/2023 0141   GFRAA >60 05/30/2017 1341    GFR Estimated Creatinine Clearance: 74.9 mL/min (by C-G formula based on SCr of 0.63 mg/dL). No results for input(s): "LIPASE", "AMYLASE" in the last 72 hours. No results for input(s): "CKTOTAL", "CKMB", "CKMBINDEX", "TROPONINI" in the last 72 hours. Invalid input(s): "POCBNP" No results for input(s): "DDIMER" in the last 72 hours. Recent Labs    01/31/23 0342  HGBA1C >15.5*   No results for input(s): "CHOL", "HDL", "LDLCALC", "TRIG", "CHOLHDL", "LDLDIRECT" in the last 72 hours. No results for input(s): "TSH", "T4TOTAL", "T3FREE", "THYROIDAB" in the last 72 hours.  Invalid input(s): "FREET3" No results for input(s): "VITAMINB12", "FOLATE", "FERRITIN", "TIBC", "IRON", "RETICCTPCT" in the last 72 hours. Coags: Recent Labs    02/01/23 1021  INR 1.0   Microbiology: Recent Results (from the past 240 hour(s))  Blood culture (routine x 2)     Status: None (Preliminary result)   Collection Time: 01/30/23  1:11 PM   Specimen: BLOOD  Result Value Ref Range Status   Specimen Description BLOOD RIGHT ANTECUBITAL  Final   Special Requests   Final    BOTTLES DRAWN AEROBIC AND ANAEROBIC Blood Culture results may not be optimal due to an inadequate volume of blood received in culture bottles   Culture   Final    NO GROWTH 3 DAYS Performed at [facility], [address], [place], [place] [address]    Report Status PENDING  Incomplete  Blood culture (routine x 2)     Status: None (Preliminary result)  Collection Time: 01/30/23  1:31 PM    Specimen: BLOOD  Result Value Ref Range Status   Specimen Description BLOOD LEFT ANTECUBITAL  Final   Special Requests   Final    BOTTLES DRAWN AEROBIC AND ANAEROBIC Blood Culture results may not be optimal due to an inadequate volume of blood received in culture bottles   Culture   Final    NO GROWTH 3 DAYS Performed at [facility], [address] [place], [place] [address]    Report Status PENDING  Incomplete  Aerobic/Anaerobic Culture w Gram Stain (surgical/deep wound)     Status: None (Preliminary result)   Collection Time: 01/30/23  5:15 PM   Specimen: Path fluid; Tissue  Result Value Ref Range Status   Specimen Description FLUID  Final   Special Requests ABSCESS  Final   Gram Stain   Final    FEW WBC PRESENT, PREDOMINANTLY PMN FEW GRAM POSITIVE COCCI IN PAIRS RARE GRAM POSITIVE RODS    Culture   Final    MODERATE METHICILLIN RESISTANT STAPHYLOCOCCUS AUREUS HOLDING FOR POSSIBLE ANAEROBE Performed at [facility], [address], [place], [place] [address]    Report Status PENDING  Incomplete   Organism ID, Bacteria METHICILLIN RESISTANT STAPHYLOCOCCUS AUREUS  Final      Susceptibility   Methicillin resistant staphylococcus aureus - MIC*    CIPROFLOXACIN >=8 RESISTANT Resistant     ERYTHROMYCIN >=8 RESISTANT Resistant     GENTAMICIN <=0.5 SENSITIVE Sensitive     OXACILLIN >=4 RESISTANT Resistant     TETRACYCLINE <=1 SENSITIVE Sensitive     VANCOMYCIN 1 SENSITIVE Sensitive     TRIMETH/SULFA <=10 SENSITIVE Sensitive     CLINDAMYCIN <=0.25 SENSITIVE Sensitive     RIFAMPIN <=0.5 SENSITIVE Sensitive     Inducible Clindamycin NEGATIVE Sensitive     LINEZOLID 2 SENSITIVE Sensitive     * MODERATE METHICILLIN RESISTANT STAPHYLOCOCCUS AUREUS    FURTHER DISCHARGE INSTRUCTIONS:  Get Medicines reviewed and adjusted: Please take all your medications with you for your next visit with your Primary MD  Laboratory/radiological data: Please request your Primary MD to  go over all hospital tests and procedure/radiological results at the follow up, please ask your Primary MD to get all Hospital records sent to his/her office.  In some cases, they will be blood work, cultures and biopsy results pending at the time of your discharge. Please request that your primary care M.D. goes through all the records of your hospital data and follows up on these results.  Also Note the following: If you experience worsening of your admission symptoms, develop shortness of breath, life threatening emergency, suicidal or homicidal thoughts you must seek medical attention immediately by calling 911 or calling your MD immediately  if symptoms less severe.  You must read complete instructions/literature along with all the possible adverse reactions/side effects for all the Medicines you take and that have been prescribed to you. Take any new Medicines after you have completely understood and accpet all the possible adverse reactions/side effects.   Do not drive when taking Pain medications or sleeping medications (Benzodaizepines)  Do not take more than prescribed Pain, Sleep and Anxiety Medications. It is not advisable to combine anxiety,sleep and pain medications without talking with your primary care practitioner  Special Instructions: If you have smoked or chewed Tobacco  in the last 2 yrs please stop smoking, stop any regular Alcohol  and or any Recreational drug use.  Wear Seat belts while driving.  Please note: You were cared for by a hospitalist during your hospital stay. Once you are discharged, your primary care physician will handle any further medical issues. Please note that NO REFILLS for any discharge medications will be authorized once you are discharged, as it is imperative that you return to your primary care physician (or establish a relationship with a primary care physician if you do not have one) for your post hospital discharge needs so that they can reassess  your need for medications and monitor your lab values.  Total Time spent coordinating discharge including counseling, education and face to face time equals greater than 30 minutes.  SignedJeoffrey Massed 02/02/2023 8:43 AM

## 2023-02-02 NOTE — Telephone Encounter (Signed)
Patient Advocate Encounter  Prior Authorization for Linezolid 600MG  tablets has been approved.    PA# [id number] Key: [id number] Proofreader Electronic PA Form Effective dates: 02/02/2023 through 03/02/2023      Roland Earl, CPhT Pharmacy Patient Advocate Specialist [facility] Patient Advocate Team Direct Number: [phone]  Fax: [phone]

## 2023-02-02 NOTE — Progress Notes (Signed)
3 Days Post-Op   Subjective/Chief Complaint: Tolerating diet and having bowel function. Discussed sitz baths and wound care   Objective: Vital signs in last 24 hours: Temp:  [98.3 F (36.8 C)-99.3 F (37.4 C)] 98.3 F (36.8 C) (05/28 2338) Pulse Rate:  [88-100] 100 (05/29 0755) Resp:  [18-20] 18 (05/29 0755) BP: (153-165)/(93-105) 153/98 (05/29 0755) SpO2:  [98 %] 98 % (05/28 1606) Weight:  [64.2 kg] 64.2 kg (05/29 0442)    Intake/Output from previous day: No intake/output data recorded. Intake/Output this shift: No intake/output data recorded.   Wound along the medial left buttock with Penrose in place, some residual edema but no significant cellulitis or drainage at this time  Lab Results:  Recent Labs    01/31/23 0342 02/01/23 0141  WBC 11.5* 9.0  HGB 11.1* 11.1*  HCT 33.7* 34.7*  PLT 318 320    BMET Recent Labs    01/31/23 0342 02/01/23 0141  NA 138 135  K 3.7 3.6  CL 102 102  CO2 27 24  GLUCOSE 212* 133*  BUN 8 17  CREATININE 0.68 0.63  CALCIUM 8.5* 8.5*    PT/INR Recent Labs    02/01/23 1021  LABPROT 13.3  INR 1.0   ABG Recent Labs    01/30/23 1321  HCO3 29.2*     Studies/Results: No results found.  Anti-infectives: Anti-infectives (From admission, onward)    Start     Dose/Rate Route Frequency Ordered Stop   02/02/23 0000  linezolid (ZYVOX) 600 MG tablet        600 mg Oral 2 times daily 02/02/23 0836 02/08/23 2359   02/01/23 2000  vancomycin (VANCOREADY) IVPB 750 mg/150 mL  Status:  Discontinued        750 mg 150 mL/hr over 60 Minutes Intravenous Every 12 hours 02/01/23 0717 02/01/23 1028   02/01/23 2000  linezolid (ZYVOX) tablet 600 mg        600 mg Oral 2 times daily 02/01/23 1028     02/01/23 0800  vancomycin (VANCOREADY) IVPB 1250 mg/250 mL        1,250 mg 166.7 mL/hr over 90 Minutes Intravenous  Once 02/01/23 0714 02/01/23 1025   01/30/23 1315  vancomycin (VANCOCIN) IVPB 1000 mg/200 mL premix        1,000 mg 200 mL/hr  over 60 Minutes Intravenous  Once 01/30/23 1306 01/30/23 1528   01/30/23 1315  ceFEPIme (MAXIPIME) 2 g in sodium chloride 0.9 % 100 mL IVPB  Status:  Discontinued        2 g 200 mL/hr over 30 Minutes Intravenous Every 8 hours 01/30/23 1310 02/01/23 0659       Assessment/Plan: s/p Procedure(s): IRRIGATION AND DEBRIDEMENT BUTTOCKS (N/A)  Continue dressing changes - dry pad, change prn  Stable for discharge, follow up and instructions  in AVS   LOS: 2 days    Juliet Rude PA-C 02/02/2023

## 2023-02-02 NOTE — TOC Transition Note (Signed)
Transition of Care [facility]) - CM/SW Discharge Note   Patient Details  Name: Lisa Cross MRN: [id number] Date of Birth: Jul 18, 1971  Transition of Care [facility]) CM/SW Contact:  Gordy Clement, RN Phone Number: 02/02/2023, 9:06 AM   Clinical Narrative:     CM called patient  in room. Patient will DC to home today.   Patient states she is comfortable with her daily dressing changes.Her Daughter will be transporting.  No TOC needs      TOC Barriers to Discharge: Continued Medical Work up   Patient Goals and CMS Choice      Discharge Placement                         Discharge Plan and Services Additional resources added to the After Visit Summary for                                       Social Determinants of Health (SDOH) Interventions SDOH Screenings   Tobacco Use: Low Risk  (01/31/2023)     Readmission Risk Interventions     No data to display

## 2023-02-02 NOTE — Progress Notes (Signed)
[facility] Progress Note  Lisa Cross 52 y.o. 06-30-1971  CC: Abnormal LFTs   Subjective: Patient seen and examined at bedside.  Denies any GI symptoms.  Getting discharged today.  ROS : aFebrile, negative for chest pain   Objective: Vital signs in last 24 hours: Vitals:   02/01/23 2338 02/02/23 0755  BP: (!) 164/104 (!) 153/98  Pulse: 88 100  Resp: 18 18  Temp: 98.3 F (36.8 C)   SpO2:      Physical Exam:  General:  Alert, cooperative, no distress, appears stated age  Head:  Normocephalic, without obvious abnormality, atraumatic  Eyes:  , EOM's intact,   Lungs:   Clear to auscultation bilaterally, respirations unlabored  Heart:  Regular rate and rhythm, S1, S2 normal  Abdomen:   Soft, non-tender, bowel sounds active all four quadrants,  no masses,   Extremities: Extremities normal, atraumatic, no  edema  Pulses: 2+ and symmetric    Lab Results: Recent Labs    01/31/23 0342 02/01/23 0141  NA 138 135  K 3.7 3.6  CL 102 102  CO2 27 24  GLUCOSE 212* 133*  BUN 8 17  CREATININE 0.68 0.63  CALCIUM 8.5* 8.5*   Recent Labs    02/01/23 0141 02/02/23 0426  AST 151* 119*  ALT 188* 160*  ALKPHOS 476* 476*  BILITOT 0.7 0.7  PROT 5.9* 6.3*  ALBUMIN 2.3* 2.5*   Recent Labs    01/30/23 1114 01/30/23 1321 01/31/23 0342 02/01/23 0141  WBC 9.2  --  11.5* 9.0  NEUTROABS 6.6  --   --   --   HGB 12.6   < > 11.1* 11.1*  HCT 40.0   < > 33.7* 34.7*  MCV 83.2  --  79.9* 80.5  PLT 376  --  318 320   < > = values in this interval not displayed.   Recent Labs    02/01/23 1021  LABPROT 13.3  INR 1.0      Assessment/Plan: -Abnormal LFTs.  Alk phos 476, AST ALT 151 and ALT 88.   - Ultrasound showed 2 cm hyperechoic lesion probably hemangioma. -S/p I&D of buttock abscess  -Mild anemia.  Will likely need outpatient workup.  She is also due for screening colonoscopy.  Recommendations -------------------------- -Hepatitis panel negative so far.   Significantly elevated GGT.  Negative ANA. -Follow  ASMA and antimitochondrial antibody. -Consider outpatient repeat ultrasound or MRI in 6 months for follow-up on 2 cm hemangioma - okay to discharge from GI standpoint. recommend follow-up in GI clinic in 2 to 3 months after discharge and also recommend to repeat LFTs with PCP in 2 weeks.   Kathi Der MD, FACP 02/02/2023, 10:40 AM  Contact #  [phone]

## 2023-02-03 LAB — MITOCHONDRIAL ANTIBODIES: Mitochondrial M2 Ab, IgG: <20 Units (ref 0.0–20.0)

## 2023-02-04 LAB — CULTURE, BLOOD (ROUTINE X 2)
Culture: NO GROWTH
Culture: NO GROWTH

## 2023-02-04 LAB — AEROBIC/ANAEROBIC CULTURE W GRAM STAIN (SURGICAL/DEEP WOUND)

## 2023-02-07 ENCOUNTER — Other Ambulatory Visit (HOSPITAL_COMMUNITY): Payer: Self-pay

## 2023-02-21 ENCOUNTER — Other Ambulatory Visit (HOSPITAL_BASED_OUTPATIENT_CLINIC_OR_DEPARTMENT_OTHER): Payer: Self-pay

## 2023-03-08 ENCOUNTER — Other Ambulatory Visit (HOSPITAL_COMMUNITY): Payer: Self-pay

## 2023-03-09 ENCOUNTER — Other Ambulatory Visit (HOSPITAL_BASED_OUTPATIENT_CLINIC_OR_DEPARTMENT_OTHER): Payer: Self-pay

## 2023-03-11 ENCOUNTER — Other Ambulatory Visit (HOSPITAL_COMMUNITY): Payer: Self-pay

## 2023-03-11 ENCOUNTER — Other Ambulatory Visit: Payer: Self-pay | Admitting: Family Medicine

## 2023-03-11 DIAGNOSIS — Z1231 Encounter for screening mammogram for malignant neoplasm of breast: Secondary | ICD-10-CM

## 2023-03-16 ENCOUNTER — Other Ambulatory Visit: Payer: Self-pay | Admitting: Gastroenterology

## 2023-03-16 DIAGNOSIS — R935 Abnormal findings on diagnostic imaging of other abdominal regions, including retroperitoneum: Secondary | ICD-10-CM

## 2023-03-16 DIAGNOSIS — K769 Liver disease, unspecified: Secondary | ICD-10-CM

## 2023-04-20 ENCOUNTER — Other Ambulatory Visit (HOSPITAL_COMMUNITY): Payer: Self-pay

## 2023-04-22 ENCOUNTER — Ambulatory Visit
Admission: RE | Admit: 2023-04-22 | Discharge: 2023-04-22 | Disposition: A | Discharge status: Home / Self Care | Payer: Managed Care, Other (non HMO) | Source: Ambulatory Visit | Attending: Family Medicine | Admitting: Family Medicine

## 2023-04-22 DIAGNOSIS — Z1231 Encounter for screening mammogram for malignant neoplasm of breast: Secondary | ICD-10-CM

## 2023-06-23 ENCOUNTER — Encounter (HOSPITAL_COMMUNITY): Payer: Self-pay

## 2023-06-23 ENCOUNTER — Ambulatory Visit (HOSPITAL_COMMUNITY)
Admission: EM | Admit: 2023-06-23 | Discharge: 2023-06-23 | Disposition: A | Discharge status: Home / Self Care | Payer: Managed Care, Other (non HMO) | Attending: Internal Medicine | Admitting: Internal Medicine

## 2023-06-23 DIAGNOSIS — H60391 Other infective otitis externa, right ear: Secondary | ICD-10-CM | POA: Diagnosis not present

## 2023-06-23 DIAGNOSIS — H9201 Otalgia, right ear: Secondary | ICD-10-CM

## 2023-06-23 MED ORDER — AMOXICILLIN-POT CLAVULANATE 875-125 MG PO TABS: 1.0000 | ORAL_TABLET | Freq: Two times a day (BID) | ORAL | 0 refills | Status: DC

## 2023-06-23 MED ORDER — OFLOXACIN 0.3 % OT SOLN: 5.0000 [drp] | Freq: Two times a day (BID) | OTIC | 0 refills | Status: DC

## 2023-06-23 NOTE — ED Provider Notes (Signed)
[facility]    CSN: [id number] Arrival date & time: 06/23/23  1326      History   Chief Complaint Chief Complaint  Patient presents with   Ear Pain   Ear Fullness    HPI Lisa Cross is a 52 y.o. female.   Patient presents to urgent care for evaluation of pain to the right ear that started approximately 1 week ago.  Nothing makes pain better or worse.  She has attempted to clear her ear with her finger and has noticed clear drainage.  No tinnitus, nausea, vomiting, fever/chills, viral URI symptoms, recent swimming, or recent antibiotic/steroid use.  No rash.  She wears a headset at work with the right ear covered and wonders if this may have contributed to symptoms.  She states she has been using ofloxacin eardrops at home without relief for the last 2 to 3 days.   Ear Fullness    Past Medical History:  Diagnosis Date   Anemia    Diabetes mellitus without complication (HCC)    Hypertension    Noncompliance with medications 10/30/2018   Formatting of this note might be different from the original. Patient is very resistant to taking medications she "believes they are poison", discussed benefits of treatment and discussed risks of non treatment    Patient Active Problem List   Diagnosis Date Noted   Left buttock abscess 01/30/2023   Noncompliance by refusing intervention or support 04/28/2019   Diabetes mellitus (HCC) 12/01/2018   Essential hypertension 02/06/2016   Hyperglycemia due to diabetes mellitus (HCC) 08/28/2015    Past Surgical History:  Procedure Laterality Date   ABDOMINAL HYSTERECTOMY     BRAIN SURGERY     CHOLECYSTECTOMY     IRRIGATION AND DEBRIDEMENT BUTTOCKS N/A 01/30/2023   Procedure: IRRIGATION AND DEBRIDEMENT BUTTOCKS;  Surgeon: Rodman Pickle, MD;  Location: [facility];  Service: General;  Laterality: N/A;    OB History   No obstetric history on file.      Home Medications    Prior to Admission medications   Medication Sig  Start Date End Date Taking? Authorizing Provider  amoxicillin-clavulanate (AUGMENTIN) 875-125 MG tablet Take 1 tablet by mouth every 12 (twelve) hours. 06/23/23  Yes Carlisle Beers, FNP  ofloxacin (FLOXIN) 0.3 % OTIC solution Place 5 drops into the right ear 2 (two) times daily. 06/23/23  Yes Carlisle Beers, FNP  amLODipine (NORVASC) 5 MG tablet Take 1 tablet (5 mg total) by mouth daily. 02/02/23   Ghimire, Werner Lean, MD  Blood Glucose Monitoring Suppl (BLOOD GLUCOSE MONITOR SYSTEM) w/Device KIT Use in the morning, at noon, and at bedtime. 02/02/23   Ghimire, Werner Lean, MD  Continuous Glucose Sensor (FREESTYLE LIBRE 2 SENSOR) MISC Please use as instructed 02/02/23   Ghimire, Werner Lean, MD  Insulin Glargine [facility] KWIKPEN) 100 UNIT/ML Inject 40 Units into the skin daily. 02/02/23   Ghimire, Werner Lean, MD  insulin lispro (HUMALOG) 100 UNIT/ML KwikPen 0-15 Units, Subcutaneous, 3 times daily with meals, CBG<70: Implement Hypoglycemia measures CBG 70-120: 0 units CBG 121-150: 2 units CBG 151-200: 3 units CBG 201-250: 5 units CBG 251-300: 8 units CBG 301-350: 11 units CBG 351-400: 15 units CBG>400: call MD 02/02/23   Maretta Bees, MD  Insulin Pen Needle 32G X 4 MM MISC Use as needed. 02/02/23   Ghimire, Werner Lean, MD  lisinopril (ZESTRIL) 20 MG tablet Take 1 tablet (20 mg total) by mouth daily. 02/02/23   Ghimire, Werner Lean, MD  metFORMIN (GLUCOPHAGE) 500 MG tablet Take 1 tablet (500 mg total) by mouth 2 (two) times daily with a meal. 02/02/23   Ghimire, Werner Lean, MD    Family History Family History  Problem Relation Age of Onset   Hypertension Mother    Hypertension Father    Dementia Maternal Grandfather    Hypertension Paternal Grandmother    Hypertension Paternal Grandfather     Social History Social History   Tobacco Use   Smoking status: Never   Smokeless tobacco: Never  Substance Use Topics   Alcohol use: No    Alcohol/week: 0.0 standard drinks of alcohol   Drug use:  No     Allergies   Patient has no known allergies.   Review of Systems Review of Systems Per HPI  Physical Exam Triage Vital Signs ED Triage Vitals  Encounter Vitals Group     BP 06/23/23 1406 (!) 147/88     Systolic BP Percentile --      Diastolic BP Percentile --      Pulse Rate 06/23/23 1405 93     Resp 06/23/23 1405 17     Temp 06/23/23 1405 99.5 F (37.5 C)     Temp Source 06/23/23 1405 Oral     SpO2 06/23/23 1405 100 %     Weight --      Height --      Head Circumference --      Peak Flow --      Pain Score 06/23/23 1405 1     Pain Loc --      Pain Education --      Exclude from Growth Chart --    No data found.  Updated Vital Signs BP (!) 147/88   Pulse 93   Temp 99.5 F (37.5 C) (Oral)   Resp 17   SpO2 100%   Visual Acuity Right Eye Distance:   Left Eye Distance:   Bilateral Distance:    Right Eye Near:   Left Eye Near:    Bilateral Near:     Physical Exam Vitals and nursing note reviewed.  Constitutional:      Appearance: She is not ill-appearing or toxic-appearing.  HENT:     Head: Normocephalic and atraumatic.     Right Ear: Hearing and external ear normal. Drainage (Copious thick purulent drainage to the right ear canal.), swelling and tenderness present.     Left Ear: Hearing, tympanic membrane, ear canal and external ear normal.     Ears:     Comments: Unable to visualize right tympanic membrane due to swelling and drainage to right ear canal.    Nose: Nose normal.     Mouth/Throat:     Lips: Pink.     Mouth: Mucous membranes are moist. No injury.     Tongue: No lesions. Tongue does not deviate from midline.     Palate: No mass and lesions.     Pharynx: Oropharynx is clear. Uvula midline. No pharyngeal swelling, oropharyngeal exudate, posterior oropharyngeal erythema or uvula swelling.     Tonsils: No tonsillar exudate or tonsillar abscesses.  Eyes:     General: Lids are normal. Vision grossly intact. Gaze aligned appropriately.      Extraocular Movements: Extraocular movements intact.     Conjunctiva/sclera: Conjunctivae normal.  Cardiovascular:     Rate and Rhythm: Normal rate and regular rhythm.     Heart sounds: Normal heart sounds, S1 normal and S2 normal.  Pulmonary:     Effort:  Pulmonary effort is normal. No respiratory distress.     Breath sounds: Normal breath sounds and air entry.  Musculoskeletal:     Cervical back: Neck supple.  Lymphadenopathy:     Cervical: Cervical adenopathy present.  Skin:    General: Skin is warm and dry.     Capillary Refill: Capillary refill takes less than 2 seconds.     Findings: No rash.  Neurological:     General: No focal deficit present.     Mental Status: She is alert and oriented to person, place, and time. Mental status is at baseline.     Cranial Nerves: No dysarthria or facial asymmetry.  Psychiatric:        Mood and Affect: Mood normal.        Speech: Speech normal.        Behavior: Behavior normal.        Thought Content: Thought content normal.        Judgment: Judgment normal.      UC Treatments / Results  Labs (all labs ordered are listed, but only abnormal results are displayed) Labs Reviewed - No data to display  EKG   Radiology No results found.  Procedures Procedures (including critical care time)  Medications Ordered in UC Medications - No data to display  Initial Impression / Assessment and Plan / UC Course  I have reviewed the triage vital signs and the nursing notes.  Pertinent labs & imaging results that were available during my care of the patient were reviewed by me and considered in my medical decision making (see chart for details).   1.  Infective otitis externa of right ear, right ear pain Symptoms have not responded well to ofloxacin at home, therefore will provide Augmentin twice daily for 7 days.  She is with low-grade temp in clinic at 99.5.  May use Tylenol/ibuprofen as needed for pain and inflammation.  Return  precautions discussed.  Counseled patient on potential for adverse effects with medications prescribed/recommended today, strict ER and return-to-clinic precautions discussed, patient verbalized understanding.    Final Clinical Impressions(s) / UC Diagnoses   Final diagnoses:  Infective otitis externa of right ear  Right ear pain     Discharge Instructions      You have an ear infection of the ear canal known as otitis externa. Augmentin twice a day for 7 days.  Use ear drops as prescribed for 7 days. Do not place anything smaller than elbow deep into ear canal- this includes Q-tips. You may place a small amount of rubbing alcohol onto the end of a Q-tip and place this into the outer ear canal to dry up any remaining water that may have gotten into the ear while showering or submerging head underwater to prevent this type of infection in the future.      ED Prescriptions     Medication Sig Dispense Auth. Provider   amoxicillin-clavulanate (AUGMENTIN) 875-125 MG tablet Take 1 tablet by mouth every 12 (twelve) hours. 14 tablet Reita May M, FNP   ofloxacin (FLOXIN) 0.3 % OTIC solution Place 5 drops into the right ear 2 (two) times daily. 5 mL Carlisle Beers, FNP      PDMP not reviewed this encounter.   Carlisle Beers, [place] 06/23/23 1517

## 2023-06-23 NOTE — ED Triage Notes (Signed)
Pt presents with c/o rt ear pain X 1 WK.   Home interventions: took abx that were prescribed at the beginning of the year. Also used ear drops.

## 2023-06-23 NOTE — Discharge Instructions (Addendum)
You have an ear infection of the ear canal known as otitis externa. Augmentin twice a day for 7 days.  Use ear drops as prescribed for 7 days. Do not place anything smaller than elbow deep into ear canal- this includes Q-tips. You may place a small amount of rubbing alcohol onto the end of a Q-tip and place this into the outer ear canal to dry up any remaining water that may have gotten into the ear while showering or submerging head underwater to prevent this type of infection in the future.

## 2023-07-07 ENCOUNTER — Telehealth (HOSPITAL_BASED_OUTPATIENT_CLINIC_OR_DEPARTMENT_OTHER): Payer: Self-pay | Admitting: Cardiology

## 2023-07-07 NOTE — Telephone Encounter (Signed)
   Pre-operative Risk Assessment    Patient Name: Lisa Cross  DOB: [birth year] MRN: [id number]      Request for Surgical Clearance    Procedure:   Right ACL repair  Date of Surgery:  Clearance 08/29/23                                 Surgeon:  Dr. Jodi Geralds Surgeon's Group or Practice Name:  [facility] Phone number:  [phone]  Fax number:  [phone]   Type of Clearance Requested:   - Medical  - Pharmacy:  Hold        Type of Anesthesia:  General    Additional requests/questions:   Caller Darel Hong) stated patient will need medical and pharmacy clearance.  Patient has New Patient visit scheduled on 12/ 13.  Signed, Annetta Maw   07/07/2023, 11:25 AM

## 2023-07-08 NOTE — Telephone Encounter (Signed)
   Name: Lisa Cross  DOB: Apr 06, 1971  MRN: [id number]  Primary Cardiologist: None  Chart reviewed as part of pre-operative protocol coverage. Because of Lisa Cross's past medical history and time since last visit, she will require a follow-up in-office visit in order to better assess preoperative cardiovascular risk.  Patient has an office visit scheduled on 08/19/2023 with Dr. Cristal Deer. Appointment notes have been updated to reflect need for pre-op evaluation.   Pre-op covering staff:  - Please contact requesting surgeon's office via preferred method (i.e, phone, fax) to inform them of need for appointment prior to surgery.   Carlos Levering, NP  07/08/2023, 12:16 PM

## 2023-07-08 NOTE — Telephone Encounter (Signed)
Called and spoke to patient to confirm new patient appt on 08/19/23 will update all parties involved.

## 2023-08-01 ENCOUNTER — Other Ambulatory Visit (HOSPITAL_COMMUNITY): Payer: Self-pay

## 2023-08-17 ENCOUNTER — Ambulatory Visit
Admission: RE | Admit: 2023-08-17 | Discharge: 2023-08-17 | Disposition: A | Discharge status: Home / Self Care | Payer: Managed Care, Other (non HMO) | Source: Ambulatory Visit | Attending: Gastroenterology | Admitting: Gastroenterology

## 2023-08-17 DIAGNOSIS — R935 Abnormal findings on diagnostic imaging of other abdominal regions, including retroperitoneum: Secondary | ICD-10-CM

## 2023-08-17 DIAGNOSIS — K769 Liver disease, unspecified: Secondary | ICD-10-CM

## 2023-08-17 MED ORDER — GADOPICLENOL 0.5 MMOL/ML IV SOLN
7.0000 mL | Freq: Once | INTRAVENOUS | Status: AC | PRN
  Administered 2023-08-17: 7 mL via INTRAVENOUS

## 2023-08-19 ENCOUNTER — Encounter (HOSPITAL_BASED_OUTPATIENT_CLINIC_OR_DEPARTMENT_OTHER): Payer: Self-pay | Admitting: Cardiology

## 2023-08-19 ENCOUNTER — Ambulatory Visit (HOSPITAL_BASED_OUTPATIENT_CLINIC_OR_DEPARTMENT_OTHER): Payer: Managed Care, Other (non HMO) | Admitting: Cardiology

## 2023-08-19 VITALS — BP 153/84 | HR 103 | Ht 65.0 in

## 2023-08-19 DIAGNOSIS — E119 Type 2 diabetes mellitus without complications: Secondary | ICD-10-CM

## 2023-08-19 DIAGNOSIS — I10 Essential (primary) hypertension: Secondary | ICD-10-CM | POA: Diagnosis not present

## 2023-08-19 DIAGNOSIS — R000 Tachycardia, unspecified: Secondary | ICD-10-CM | POA: Diagnosis not present

## 2023-08-19 DIAGNOSIS — Z794 Long term (current) use of insulin: Secondary | ICD-10-CM

## 2023-08-19 DIAGNOSIS — Z7189 Other specified counseling: Secondary | ICD-10-CM

## 2023-08-19 DIAGNOSIS — Z01810 Encounter for preprocedural cardiovascular examination: Secondary | ICD-10-CM

## 2023-08-19 NOTE — Progress Notes (Signed)
Cardiology Office Note:  .   Date:  08/28/2023  ID:  Lisa Cross, DOB 07/31/1971, MRN [id number] PCP: Deatra James, MD  [facility] Providers Cardiologist:  Jodelle Red, MD {  History of Present Illness: .   Lisa Cross is a 52 y.o. female with PMH type II diabetes, hypertension who was seen 08/19/23 for preoperative cardiovascular evaluation.  Today: She was referred for preoperative evaluation and tachycardia. She is planned for R ACL repair with Dr. Luiz Blare on 08/29/23. Referral originally from Dr. Georgiann Cocker at [facility]. Note from 05/02/23 reviewed, noted chronic elevated heart rate. Referred to cardiology for evaluation prior to colonoscopy.  Feels like her heart has been fast her whole life. ECG from 2018 showed sinus at 100 bpm. No family history of heart disease. Drinks no caffeine, never smoker. No alcohol. No OTC supplements.   She works in [employer], gets [employer]. No other intentional exercise. Drinks 8 glasses or more of water per day.   Has had insulin for "years" but started insulin this year. Has had high blood pressure for years as well.   ROS: Denies chest pain, shortness of breath at rest or with normal exertion. No PND, orthopnea, LE edema or unexpected weight gain. No syncope or palpitations. ROS otherwise negative except as noted.   Studies Reviewed: Marland Kitchen    EKG:  EKG Interpretation Date/Time:  Friday August 19 2023 15:52:04 EST Ventricular Rate:  100 PR Interval:  172 QRS Duration:  80 QT Interval:  362 QTC Calculation: 466 R Axis:   5  Text Interpretation: Normal sinus rhythm Possible Left atrial enlargement Septal infarct , age undetermined Confirmed by Jodelle Red [phone]) on 08/28/2023 8:24:32 PM    Physical Exam:   VS:  BP (!) 153/84 (BP Location: Right Arm, Patient Position: Sitting, Cuff Size: Normal)   Pulse (!) 103   Ht 5\' 5"  (1.651 m)   SpO2 99%   BMI 23.55 kg/m    Wt Readings from Last 3 Encounters:   02/02/23 141 lb 8.6 oz (64.2 kg)  12/31/21 165 lb (74.8 kg)  05/30/17 215 lb (97.5 kg)    GEN: Well nourished, well developed in no acute distress HEENT: Normal, moist mucous membranes NECK: No JVD CARDIAC: regular rhythm, normal S1 and S2, no rubs or gallops. No murmur. VASCULAR: Radial and DP pulses 2+ bilaterally. No carotid bruits RESPIRATORY:  Clear to auscultation without rales, wheezing or rhonchi  ABDOMEN: Soft, non-tender, non-distended MUSCULOSKELETAL:  Ambulates independently SKIN: Warm and dry, no edema NEUROLOGIC:  Alert and oriented x 3. No focal neuro deficits noted. PSYCHIATRIC:  Normal affect    ASSESSMENT AND PLAN: .    Preoperative cardiovascular evaluation According to the Revised Cardiac Risk Index (RCRI), her Perioperative Risk of Major Cardiac Event is (%): 0.9  Her Functional Capacity in METs is: 8.27 according to the Duke Activity Status Index (DASI).  The patient is not currently having active cardiac symptoms, and they can achieve >4 METs of activity.  According to ACC/AHA Guidelines, no further testing is needed.  Proceed with surgery at acceptable risk.  Our service is available as needed in the peri-operative period.     Elevated heart rate -longstanding, stable, sinus -no syncope or high risk symptoms  Hypertension -currently taking amlodipine 5 mg daily only -always high, including home numbers, since being off medications -had been low in the past, was told to stop her lisinopril 6-8 weeks ago -recommend restarting her lisinopril, check home blood pressures  Type  II diabetes -on insulin -reports that no one has ever discussed statin or aspirin. Her cholesterol numbers are excellent, but she still would get additional benefit from a less intense statin such as pravastatin. She will discuss this with her PCP at her follow up visit  CV risk counseling and prevention -recommend heart healthy/Mediterranean diet, with whole grains, fruits,  vegetable, fish, lean meats, nuts, and olive oil. Limit salt. -recommend moderate walking, 3-5 times/week for 30-50 minutes each session. Aim for at least 150 minutes.week. Goal should be pace of 3 miles/hours, or walking 1.5 miles in 30 minutes -recommend avoidance of tobacco products. Avoid excess alcohol.  Dispo: as needed  Signed, Jodelle Red, MD   Jodelle Red, MD, PhD, [facility] [place]  [facility]  [facility] at [facility] [address], [address] [place], [place] [address] [phone]

## 2023-08-19 NOTE — Patient Instructions (Addendum)
Restart home lisinopril 20 mg daily. Check home blood pressures, goal <130/80. If it is running low with restarting this, please let us know.  Return as needed

## 2023-10-10 ENCOUNTER — Other Ambulatory Visit (HOSPITAL_COMMUNITY): Payer: Self-pay

## 2023-11-11 ENCOUNTER — Other Ambulatory Visit: Payer: Self-pay

## 2023-11-17 ENCOUNTER — Other Ambulatory Visit (HOSPITAL_COMMUNITY): Payer: Self-pay

## 2023-11-17 ENCOUNTER — Other Ambulatory Visit (HOSPITAL_BASED_OUTPATIENT_CLINIC_OR_DEPARTMENT_OTHER): Payer: Self-pay

## 2023-11-18 ENCOUNTER — Other Ambulatory Visit (HOSPITAL_COMMUNITY): Payer: Self-pay

## 2023-11-21 ENCOUNTER — Other Ambulatory Visit (HOSPITAL_COMMUNITY): Payer: Self-pay

## 2023-11-22 ENCOUNTER — Other Ambulatory Visit (HOSPITAL_COMMUNITY): Payer: Self-pay

## 2023-11-25 ENCOUNTER — Other Ambulatory Visit (HOSPITAL_COMMUNITY): Payer: Self-pay

## 2024-02-23 ENCOUNTER — Other Ambulatory Visit (HOSPITAL_COMMUNITY): Payer: Self-pay | Admitting: Gastroenterology

## 2024-02-23 DIAGNOSIS — R7989 Other specified abnormal findings of blood chemistry: Secondary | ICD-10-CM

## 2024-03-16 ENCOUNTER — Other Ambulatory Visit: Payer: Self-pay

## 2024-03-16 ENCOUNTER — Other Ambulatory Visit: Payer: Self-pay | Admitting: Student

## 2024-03-16 ENCOUNTER — Other Ambulatory Visit (HOSPITAL_COMMUNITY): Payer: Self-pay

## 2024-03-16 ENCOUNTER — Other Ambulatory Visit: Payer: Self-pay | Admitting: Radiology

## 2024-03-16 DIAGNOSIS — R7989 Other specified abnormal findings of blood chemistry: Secondary | ICD-10-CM

## 2024-03-19 ENCOUNTER — Ambulatory Visit (HOSPITAL_COMMUNITY)
Admission: RE | Admit: 2024-03-19 | Discharge: 2024-03-19 | Disposition: A | Discharge status: Home / Self Care | Source: Ambulatory Visit | Attending: Gastroenterology | Admitting: Gastroenterology

## 2024-03-19 ENCOUNTER — Other Ambulatory Visit: Payer: Self-pay

## 2024-03-19 DIAGNOSIS — K769 Liver disease, unspecified: Secondary | ICD-10-CM | POA: Diagnosis not present

## 2024-03-19 DIAGNOSIS — R7989 Other specified abnormal findings of blood chemistry: Secondary | ICD-10-CM | POA: Diagnosis present

## 2024-03-19 DIAGNOSIS — R748 Abnormal levels of other serum enzymes: Secondary | ICD-10-CM | POA: Diagnosis not present

## 2024-03-19 LAB — CBC
HCT: 35 % — ABNORMAL LOW (ref 36.0–46.0)
Hemoglobin: 11 g/dL — ABNORMAL LOW (ref 12.0–15.0)
MCH: 26.4 pg (ref 26.0–34.0)
MCHC: 31.4 g/dL (ref 30.0–36.0)
MCV: 83.9 fL (ref 80.0–100.0)
Platelets: 363 K/uL (ref 150–400)
RBC: 4.17 MIL/uL (ref 3.87–5.11)
RDW: 13.7 % (ref 11.5–15.5)
WBC: 8.4 K/uL (ref 4.0–10.5)
nRBC: 0 % (ref 0.0–0.2)

## 2024-03-19 LAB — GLUCOSE, CAPILLARY: Glucose-Capillary: 206 mg/dL — ABNORMAL HIGH (ref 70–99)

## 2024-03-19 LAB — PROTIME-INR
INR: 1 (ref 0.8–1.2)
Prothrombin Time: 13.6 s (ref 11.4–15.2)

## 2024-03-19 MED ORDER — SODIUM CHLORIDE 0.9% FLUSH: 3.0000 mL | INTRAVENOUS | Status: DC | PRN

## 2024-03-19 MED ORDER — MIDAZOLAM HCL 2 MG/2ML IJ SOLN
INTRAMUSCULAR | Status: AC | PRN
  Administered 2024-03-19: 1 mg via INTRAVENOUS

## 2024-03-19 MED ORDER — HYDROCODONE-ACETAMINOPHEN 5-325 MG PO TABS: 1.0000 | ORAL_TABLET | ORAL | Status: DC | PRN

## 2024-03-19 MED ORDER — SODIUM CHLORIDE 0.9% FLUSH: 3.0000 mL | Freq: Two times a day (BID) | INTRAVENOUS | Status: DC

## 2024-03-19 MED ORDER — ACETAMINOPHEN 325 MG PO TABS: 650.0000 mg | ORAL_TABLET | Freq: Four times a day (QID) | ORAL | Status: DC | PRN

## 2024-03-19 MED ORDER — MIDAZOLAM HCL 2 MG/2ML IJ SOLN
INTRAMUSCULAR | Status: AC
  Filled 2024-03-19: qty 4

## 2024-03-19 MED ORDER — GELATIN ABSORBABLE 12-7 MM EX MISC
1.0000 | Freq: Once | CUTANEOUS | Status: AC
  Administered 2024-03-19: 1 via TOPICAL

## 2024-03-19 MED ORDER — FENTANYL CITRATE (PF) 100 MCG/2ML IJ SOLN
INTRAMUSCULAR | Status: AC
  Filled 2024-03-19: qty 2

## 2024-03-19 MED ORDER — LIDOCAINE HCL (PF) 1 % IJ SOLN
10.0000 mL | Freq: Once | INTRAMUSCULAR | Status: AC
  Administered 2024-03-19: 10 mL via INTRADERMAL

## 2024-03-19 MED ORDER — FENTANYL CITRATE (PF) 100 MCG/2ML IJ SOLN
INTRAMUSCULAR | Status: AC | PRN
  Administered 2024-03-19: 50 ug via INTRAVENOUS

## 2024-03-19 MED ORDER — DIPHENHYDRAMINE HCL 25 MG PO CAPS
25.0000 mg | ORAL_CAPSULE | Freq: Once | ORAL | Status: AC
  Administered 2024-03-19: 25 mg via ORAL
  Filled 2024-03-19: qty 1

## 2024-03-19 NOTE — H&P (Signed)
 Chief Complaint: Patient was seen in consultation today for liver biopsy   Referring Physician(s): Brahmbhatt,Parag  Supervising Physician: Dr. Maude Naegeli  Patient Status: [facility]  History of Present Illness: Lisa Cross is a 53 y.o. female being worked up for elevated liver enzymes.  She is referred for image guided non target liver biopsy.  PMHx, meds, labs, imaging, allergies reviewed. Feels well, no recent fevers, chills, illness. Has been NPO today as directed.   Past Medical History:  Diagnosis Date   Anemia    Diabetes mellitus without complication (HCC)    DM (diabetes mellitus) (HCC)    Hypercholesterolemia    Hypertension    Noncompliance with medications 10/30/2018   Formatting of this note might be different from the original. Patient is very resistant to taking medications she believes they are poison, discussed benefits of treatment and discussed risks of non treatment   Right knee pain     Past Surgical History:  Procedure Laterality Date   ABDOMINAL HYSTERECTOMY     BRAIN SURGERY     CHOLECYSTECTOMY     IRRIGATION AND DEBRIDEMENT BUTTOCKS N/A 01/30/2023   Procedure: IRRIGATION AND DEBRIDEMENT BUTTOCKS;  Surgeon: Stevie Herlene Righter, MD;  Location: [facility];  Service: General;  Laterality: N/A;    Allergies: Patient has no known allergies.  Medications: Prior to Admission medications   Medication Sig Start Date End Date Taking? Authorizing Provider  amLODipine  (NORVASC ) 5 MG tablet Take 1 tablet (5 mg total) by mouth daily. 02/02/23  Yes Ghimire, Donalda HERO, MD  Insulin  Glargine (BASAGLAR  KWIKPEN) 100 UNIT/ML Inject 40 Units into the skin daily. 02/02/23  Yes Ghimire, Donalda HERO, MD  insulin  lispro (HUMALOG ) 100 UNIT/ML KwikPen 0-15 Units, Subcutaneous, 3 times daily with meals, CBG<70: Implement Hypoglycemia measures CBG 70-120: 0 units CBG 121-150: 2 units CBG 151-200: 3 units CBG 201-250: 5 units CBG 251-300: 8 units CBG 301-350: 11 units  CBG 351-400: 15 units CBG>400: call MD 02/02/23  Yes Ghimire, Donalda HERO, MD  lisinopril  (ZESTRIL ) 20 MG tablet Take 1 tablet (20 mg total) by mouth daily. 02/02/23  Yes Ghimire, Donalda HERO, MD  Blood Glucose Monitoring Suppl (BLOOD GLUCOSE MONITOR SYSTEM) w/Device KIT Use in the morning, at noon, and at bedtime. 02/02/23   Ghimire, Donalda HERO, MD  Continuous Glucose Sensor (FREESTYLE LIBRE 2 SENSOR) MISC Please use as instructed 02/02/23   Ghimire, Donalda HERO, MD  Insulin  Pen Needle 32G X 4 MM MISC Use as needed. 02/02/23   Ghimire, Donalda HERO, MD     Family History  Problem Relation Age of Onset   Hypertension Mother    Hypertension Father    Dementia Maternal Grandfather    Hypertension Paternal Grandmother    Hypertension Paternal Grandfather     Social History   Socioeconomic History   Marital status: Divorced    Spouse name: Not on file   Number of children: Not on file   Years of education: Not on file   Highest education level: Not on file  Occupational History   Not on file  Tobacco Use   Smoking status: Never   Smokeless tobacco: Never  Vaping Use   Vaping status: Not on file  Substance and Sexual Activity   Alcohol use: No    Alcohol/week: 0.0 standard drinks of alcohol   Drug use: No   Sexual activity: Yes    Partners: Male    Birth control/protection: Condom  Other Topics Concern   Not on file  Social History Narrative   Not on file   Social Drivers of Health   Financial Resource Strain: Not on file  Food Insecurity: Not on file  Transportation Needs: Not on file  Physical Activity: Not on file  Stress: Not on file  Social Connections: Not on file    Review of Systems: A 12 point ROS discussed and pertinent positives are indicated in the HPI above.  All other systems are negative.  Review of Systems  Vital Signs: BP (!) 140/89   Pulse 95   Temp 98.1 F (36.7 C) (Oral)   Resp 20   Ht 5' 5 (1.651 m)   Wt 155 lb (70.3 kg)   SpO2 97%   BMI 25.79 kg/m    Physical Exam Constitutional:      Appearance: She is not ill-appearing.  HENT:     Mouth/Throat:     Mouth: Mucous membranes are moist.     Pharynx: Oropharynx is clear.  Cardiovascular:     Rate and Rhythm: Normal rate and regular rhythm.     Heart sounds: Normal heart sounds.  Pulmonary:     Effort: Pulmonary effort is normal. No respiratory distress.     Breath sounds: Normal breath sounds.  Abdominal:     General: There is no distension.     Palpations: Abdomen is soft.     Tenderness: There is no abdominal tenderness.  Skin:    General: Skin is warm and dry.     Coloration: Skin is not jaundiced.  Neurological:     General: No focal deficit present.     Mental Status: She is alert and oriented to person, place, and time.  Psychiatric:        Mood and Affect: Mood normal.        Thought Content: Thought content normal.     Imaging: No results found.  Labs:  CBC: Recent Labs    03/19/24 0633  WBC 8.4  HGB 11.0*  HCT 35.0*  PLT 363    COAGS: Recent Labs    03/19/24 0633  INR 1.0    Assessment and Plan: Elevated liver enzymes Plan for image guided random liver biopsy Labs reviewed. Risks and benefits of liver biopsy was discussed with the patient and/or patient's family including, but not limited to bleeding, infection, damage to adjacent structures or low yield requiring additional tests.  All of the questions were answered and there is agreement to proceed.  Consent signed and in chart.    Electronically Signed: Franky Rusk, PA-C 03/19/2024, 7:57 AM   I spent a total of 20 minutes in face to face in clinical consultation, greater than 50% of which was counseling/coordinating care for liver biopsy

## 2024-03-19 NOTE — Discharge Instructions (Signed)
 Drink plenty of fluids over the next 2-3 days.  Needle Biopsy, Care After These instructions tell you how to care for yourself after your procedure. Your doctor may give you more instructions. Call your doctor if you have any problems or questions. What can I expect after the procedure? After a needle biopsy, it is common to have these things at the puncture site: Soreness. Bruising. Mild pain. These things should go away after a few days. Follow these instructions at home: Puncture site care  Wash your hands with soap and water for at least 20 seconds before and after you change your bandage (dressing). If you cannot use soap and water, use hand sanitizer. Follow instructions from your doctor about how to take care of your puncture site. This includes: Remove Dressing in 24 hours.  You May Shower In 24 Hours. DO NOT SCRUB over site.  Check your puncture site every day for signs of infection. Check for: Redness, swelling, or more pain. Fluid or blood. Warmth. Pus or a bad smell. General instructions Go back to your normal activities when your doctor says that it is safe. Ask if there is anything you cannot do as you heal. Take over-the-counter and prescription medicines only as told by your doctor. Do not take baths, swim, or use a hot tub for at least 5 days.  Keep all follow-up visits. Ask if you need an appointment to get your biopsy results. Contact a doctor if: You have a fever. You have redness, swelling, or more pain at the puncture site, and it lasts longer than a few days. You have fluid, blood, or pus coming from the puncture site. Your puncture site feels warm. Get help right away if: You have very bad bleeding from the puncture site. Summary After the procedure, it is common to have soreness, bruising, or mild pain at the puncture site. Check your puncture site every day for signs of infection, such as redness, swelling, or more pain. Get help right away if you have  very bad bleeding from your puncture site. This information is not intended to replace advice given to you by your health care provider. Make sure you discuss any questions you have with your health care provider. Document Revised: 02/11/2021 Document Reviewed: 02/11/2021 Elsevier Patient Education  2024 [employer].

## 2024-03-20 LAB — SURGICAL PATHOLOGY

## 2024-03-21 ENCOUNTER — Other Ambulatory Visit: Payer: Self-pay | Admitting: Family Medicine

## 2024-03-21 DIAGNOSIS — Z1231 Encounter for screening mammogram for malignant neoplasm of breast: Secondary | ICD-10-CM

## 2024-03-23 ENCOUNTER — Other Ambulatory Visit (HOSPITAL_COMMUNITY): Payer: Self-pay

## 2024-04-23 ENCOUNTER — Ambulatory Visit
Admission: RE | Admit: 2024-04-23 | Discharge: 2024-04-23 | Disposition: A | Discharge status: Home / Self Care | Source: Ambulatory Visit | Attending: Family Medicine | Admitting: Family Medicine

## 2024-04-23 DIAGNOSIS — Z1231 Encounter for screening mammogram for malignant neoplasm of breast: Secondary | ICD-10-CM
